# Patient Record
Sex: Female | Born: 1950 | Race: White | Hispanic: No | Marital: Married | Smoking: Never smoker
Health system: Southern US, Community
[De-identification: ages and names within clinical notes are randomized; demographics above are authoritative.]

## PROBLEM LIST (undated history)

## (undated) DIAGNOSIS — G43909 Migraine, unspecified, not intractable, without status migrainosus: Secondary | ICD-10-CM

## (undated) DIAGNOSIS — K449 Diaphragmatic hernia without obstruction or gangrene: Secondary | ICD-10-CM

## (undated) DIAGNOSIS — D649 Anemia, unspecified: Secondary | ICD-10-CM

## (undated) DIAGNOSIS — F329 Major depressive disorder, single episode, unspecified: Secondary | ICD-10-CM

## (undated) DIAGNOSIS — F32A Depression, unspecified: Secondary | ICD-10-CM

## (undated) DIAGNOSIS — N200 Calculus of kidney: Secondary | ICD-10-CM

## (undated) DIAGNOSIS — M1990 Unspecified osteoarthritis, unspecified site: Secondary | ICD-10-CM

## (undated) HISTORY — PX: JOINT REPLACEMENT: SHX530

## (undated) HISTORY — PX: ABDOMINAL HYSTERECTOMY: SHX81

## (undated) HISTORY — DX: Calculus of kidney: N20.0

## (undated) HISTORY — DX: Major depressive disorder, single episode, unspecified: F32.9

## (undated) HISTORY — DX: Unspecified osteoarthritis, unspecified site: M19.90

## (undated) HISTORY — PX: TMJ ARTHROPLASTY: SHX1066

## (undated) HISTORY — DX: Migraine, unspecified, not intractable, without status migrainosus: G43.909

## (undated) HISTORY — DX: Depression, unspecified: F32.A

---

## 2010-12-09 NOTE — H&P (Addendum)
[name], [name]                  ACCOUNT NO.:  [id number]  MEDICAL RECORD NO.:  [id number]  LOCATION:                               FACILITY:  [facility]  PHYSICIAN:  Madlyn Frankel. Charlann Boxer, M.D.  DATE OF BIRTH:  Aug 20, 1950  DATE OF ADMISSION:  12/01/2010 DATE OF DISCHARGE:                             HISTORY & PHYSICAL   DATE OF SURGERY:  December 13, 2010  CHIEF COMPLAINT:  Left knee osteoarthritis.  HISTORY OF PRESENT ILLNESS:  The patient is a 60 year old white female in no acute distress.  The patient states that she has been having left knee pain for about 5 years.  This has just progressively been increasing.  The patient denies any initial trauma or incident.  The patient has had a previous left knee arthroscopy with Dr.  Fabio Pierce results of which did not help her pain.  She then was discussing this with a friend who recommended Dr. Charlann Boxer.  She presented to the clinic, x- rays were taken which did show arthritic changes within the left knee. The patient has tried anti-inflammatories, steroid injections, and Synvisc injections, all of which were minimally effective at first, but eventually her symptoms returned.  Various options were discussed with the patient.  The patient wishes to proceed with surgery.  Risks, benefits, and expectations of procedure discussed with the patient.  The patient understands risks, benefits, and expectations and wishes to proceed with surgery.  The patient is unsure of where she will be discharged after the hospital, either home or rehab.  The patient has not been given her postop medications.  The patient is a candidate for tranexamic acid and will be given this prior to surgery.  PRIMARY CARE PHYSICIAN:  Web designer.  PAST MEDICAL HISTORY: 1. Migraines. 2. Impaired memory, vision, and hearing. 3. Arthritis.  PAST SURGICAL HISTORY: 1. TMJ in 1991. 2. Carpal tunnel on the right side in 1987. 3. Carpal tunnel on the left in 1988. 4.  Ovarian cyst excision in 1981. 5. Polyps removed in 2004. 6. Left knee arthroscopy, unknown year.  MEDICATIONS: 1. Imitrex nose spray p.r.n. 2. Tylenol. 3. Lortab 5/500, 1 to 2  p.o. q.4-6 hours p.r.n. pain.  ALLERGIES:  The patient states she is allergic to both NSAIDs and COMPAZINE.  SOCIAL HISTORY:  The patient denies use of both alcohol and tobacco.  REVIEW OF SYSTEMS:  The patient does state she has headaches and she has joint pain, joint swelling, muscular pain, spasms, and morning stiffness of the left knee especially.  PHYSICAL EXAMINATION:  GENERAL:  The patient is a 60 year old white female in no acute distress. VITAL SIGNS:  Stable.  Blood pressure is 150/100, pulse 109, respirations 20. HEENT:  Pupils equal round and reactive to light and accommodation. Throat is clear. NECK:  Supple.  No JVD.  No carotid bruits.  No lymphadenopathy noted. CARDIAC:  Normal appearing S1 and S2.  No murmur appreciated. RESPIRATORY:  Lungs clear to auscultation bilaterally. NEURO:  The patient is oriented x3. ORTHO:  Pertaining to the left knee, the patient has pain on palpation over the medial anterior tibial surface.  No pain on rest of the entire knee.  There is no pain over the quadriceps tendon, patellar tendon, or patella.  There is no pain over the medial or lateral joint lines. Range of motion is limited to almost 0 degrees to about 90.  The patient has a coarse crepitus.  Retropatellar and range of motion is painful. The patient also has pain with going up or down steps.  The patient has +1 dorsalis pedis pulse.  The patient is distally neurovascularly intact.  IMPRESSION:  Left knee osteoarthritis.  STUDIES:  X-rays as above.  PLAN:  The patient admitted to the hospital to undergo left total knee replacement by Dr. Charlann Boxer at [facility] on December 13, 2010. Risks, benefits and expectations of procedure discussed with the patient.  The patient understands risks,  benefits, and expectations and wishes to proceed with surgery.    ______________________________ Lanney Gins, PA   ______________________________ Madlyn Frankel. Charlann Boxer, M.D.    MB/MEDQ  D:  12/08/2010  T:  12/08/2010  Job:  [id number]  Electronically Signed by Lanney Gins PA on 01/18/2011 02:14:24 PM Electronically Signed by Durene Romans M.D. on 01/20/2011 11:02:23 AM

## 2010-12-10 ENCOUNTER — Other Ambulatory Visit: Payer: Self-pay | Admitting: Orthopedic Surgery

## 2010-12-10 ENCOUNTER — Encounter (HOSPITAL_COMMUNITY): Payer: Managed Care, Other (non HMO)

## 2010-12-10 LAB — URINALYSIS, ROUTINE W REFLEX MICROSCOPIC
Bilirubin Urine: NEGATIVE
Glucose, UA: NEGATIVE mg/dL
Ketones, ur: NEGATIVE mg/dL
Leukocytes, UA: NEGATIVE
pH: 5.5 (ref 5.0–8.0)

## 2010-12-10 LAB — BASIC METABOLIC PANEL
BUN: 11 mg/dL (ref 6–23)
CO2: 25 mEq/L (ref 19–32)
Chloride: 103 mEq/L (ref 96–112)
Creatinine, Ser: 0.65 mg/dL (ref 0.50–1.10)
Glucose, Bld: 102 mg/dL — ABNORMAL HIGH (ref 70–99)

## 2010-12-10 LAB — DIFFERENTIAL
Basophils Relative: 1 % (ref 0–1)
Eosinophils Absolute: 0.1 10*3/uL (ref 0.0–0.7)
Monocytes Absolute: 0.5 10*3/uL (ref 0.1–1.0)
Monocytes Relative: 8 % (ref 3–12)

## 2010-12-10 LAB — CBC
HCT: 41.3 % (ref 36.0–46.0)
Hemoglobin: 13.1 g/dL (ref 12.0–15.0)
MCH: 28.1 pg (ref 26.0–34.0)
MCHC: 31.7 g/dL (ref 30.0–36.0)

## 2010-12-13 ENCOUNTER — Ambulatory Visit (HOSPITAL_COMMUNITY)
Admission: RE | Admit: 2010-12-13 | Discharge: 2010-12-13 | Disposition: A | Discharge status: Home / Self Care | Payer: Managed Care, Other (non HMO) | Source: Ambulatory Visit | Attending: Orthopedic Surgery | Admitting: Orthopedic Surgery

## 2010-12-13 DIAGNOSIS — Z79899 Other long term (current) drug therapy: Secondary | ICD-10-CM | POA: Insufficient documentation

## 2010-12-13 DIAGNOSIS — Z01812 Encounter for preprocedural laboratory examination: Secondary | ICD-10-CM | POA: Insufficient documentation

## 2010-12-13 DIAGNOSIS — M1710 Unilateral primary osteoarthritis, unspecified knee: Secondary | ICD-10-CM | POA: Insufficient documentation

## 2010-12-13 LAB — TYPE AND SCREEN
ABO/RH(D): A NEG
Antibody Screen: NEGATIVE

## 2010-12-29 ENCOUNTER — Other Ambulatory Visit: Payer: Self-pay | Admitting: Orthopedic Surgery

## 2010-12-29 ENCOUNTER — Encounter (HOSPITAL_COMMUNITY): Payer: Managed Care, Other (non HMO)

## 2010-12-29 LAB — BASIC METABOLIC PANEL
CO2: 26 mEq/L (ref 19–32)
Calcium: 9.2 mg/dL (ref 8.4–10.5)
GFR calc non Af Amer: >90 mL/min (ref >90)
Sodium: 137 mEq/L (ref 135–145)

## 2010-12-29 LAB — CBC
HCT: 41.6 % (ref 36.0–46.0)
Hemoglobin: 13.1 g/dL (ref 12.0–15.0)
MCH: 28.4 pg (ref 26.0–34.0)
RBC: 4.62 MIL/uL (ref 3.87–5.11)

## 2010-12-29 LAB — SURGICAL PCR SCREEN
MRSA, PCR: NEGATIVE
Staphylococcus aureus: POSITIVE — AB

## 2010-12-29 LAB — DIFFERENTIAL
Basophils Relative: 0 % (ref 0–1)
Lymphocytes Relative: 30 % (ref 12–46)
Lymphs Abs: 2.1 10*3/uL (ref 0.7–4.0)
Monocytes Relative: 6 % (ref 3–12)
Neutro Abs: 4.4 10*3/uL (ref 1.7–7.7)
Neutrophils Relative %: 63 % (ref 43–77)

## 2010-12-29 LAB — URINALYSIS, ROUTINE W REFLEX MICROSCOPIC
Glucose, UA: NEGATIVE mg/dL
Leukocytes, UA: NEGATIVE
pH: 5 (ref 5.0–8.0)

## 2010-12-29 LAB — APTT: aPTT: 28 seconds (ref 24–37)

## 2010-12-29 LAB — PROTIME-INR: Prothrombin Time: 12.5 seconds (ref 11.6–15.2)

## 2011-01-03 ENCOUNTER — Inpatient Hospital Stay (HOSPITAL_COMMUNITY)
Admission: RE | Admit: 2011-01-03 | Discharge: 2011-01-06 | DRG: 470 | Disposition: A | Discharge status: Home Health | Payer: Managed Care, Other (non HMO) | Source: Ambulatory Visit | Attending: Orthopedic Surgery | Admitting: Orthopedic Surgery

## 2011-01-03 DIAGNOSIS — Z6833 Body mass index (BMI) 33.0-33.9, adult: Secondary | ICD-10-CM

## 2011-01-03 DIAGNOSIS — M1710 Unilateral primary osteoarthritis, unspecified knee: Principal | ICD-10-CM | POA: Diagnosis present

## 2011-01-03 DIAGNOSIS — M129 Arthropathy, unspecified: Secondary | ICD-10-CM | POA: Diagnosis present

## 2011-01-03 DIAGNOSIS — H9190 Unspecified hearing loss, unspecified ear: Secondary | ICD-10-CM | POA: Diagnosis present

## 2011-01-03 DIAGNOSIS — Z01812 Encounter for preprocedural laboratory examination: Secondary | ICD-10-CM

## 2011-01-03 DIAGNOSIS — H547 Unspecified visual loss: Secondary | ICD-10-CM | POA: Diagnosis present

## 2011-01-03 DIAGNOSIS — R413 Other amnesia: Secondary | ICD-10-CM | POA: Diagnosis present

## 2011-01-03 DIAGNOSIS — Z7382 Dual sensory impairment: Secondary | ICD-10-CM

## 2011-01-04 LAB — CBC
HCT: 28.6 % — ABNORMAL LOW (ref 36.0–46.0)
MCH: 28.3 pg (ref 26.0–34.0)
MCHC: 31.8 g/dL (ref 30.0–36.0)
MCV: 89.1 fL (ref 78.0–100.0)
RDW: 15.9 % — ABNORMAL HIGH (ref 11.5–15.5)

## 2011-01-04 LAB — BASIC METABOLIC PANEL
BUN: 9 mg/dL (ref 6–23)
Creatinine, Ser: 0.59 mg/dL (ref 0.50–1.10)
GFR calc Af Amer: >90 mL/min (ref >90)
GFR calc non Af Amer: >90 mL/min (ref >90)

## 2011-01-05 LAB — CBC
MCHC: 32 g/dL (ref 30.0–36.0)
RDW: 16.1 % — ABNORMAL HIGH (ref 11.5–15.5)

## 2011-01-05 LAB — BASIC METABOLIC PANEL
BUN: 5 mg/dL — ABNORMAL LOW (ref 6–23)
GFR calc Af Amer: >90 mL/min (ref >90)
GFR calc non Af Amer: >90 mL/min (ref >90)
Potassium: 3.5 mEq/L (ref 3.5–5.1)
Sodium: 137 mEq/L (ref 135–145)

## 2011-01-10 NOTE — Op Note (Signed)
NAMEJAIDAH, [name]                  ACCOUNT NO.:  [id number]  MEDICAL RECORD NO.:  [id number]  LOCATION:  1537                         FACILITY:  [facility]  PHYSICIAN:  Andrea Frankel. Charlann Figueroa, M.D.  DATE OF BIRTH:  06-Dec-1950  DATE OF PROCEDURE:  01/03/2011 DATE OF DISCHARGE:                              OPERATIVE REPORT   PREOPERATIVE DIAGNOSIS:  Left knee osteoarthritis.  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  PROCEDURE:  Left total knee replacement utilizing Smith and Nephew titanium size 3 femoral component, posterior stabilized with size 2 titanium tibial base plate, 11 mm posterior stabilized insert, and a 35 patellar button.  SURGEON:  Andrea Frankel. Charlann Figueroa, M.D.  ASSISTANT:  Andrea Gins, PA.  Please note that physician assistant [name] was present for the entirely case from preoperative positioning, perioperative retractor placement and management as well as general facilitation of the case as well as primary wound closure.  ANESTHESIA:  Spinal.  SPECIMENS:  None.  COMPLICATION:  None.  DRAINS:  One Hemovac.  TOURNIQUET TIME:  45 minutes at 250 mmHg.  INDICATION FOR PROCEDURE:  [name] is a 60 year old female who had presented to the office for left knee pain.  Radiographs revealed bone- on-bone arthritis of the left knee mainly anterior and medial.  She had failed conservative measures, cortisone injections, and antiinflammatories.  She had already been placed on narcotics unfortunately.  After reviewing risks of infection, DVT, component failure, need for revision surgery, as well as the postoperative course and expectations particularly given her narcotic use, she was consented for left knee replacement.  DETAILS OF PROCEDURE:  The patient was brought to the operative theater. Once adequate anesthesia, preoperative antibiotics administered, patient was positioned supine with left thigh tourniquet placed.  The left  lower extremity was then prepped and draped in a sterile fashion with the foot placed in a Mayo leg holder.  Time out was performed identifying the patient, planned procedure, and extremity.  The left lower extremity was exsanguinated tourniquet elevated to 250 mmHg.  A midline incision was made followed by medial parapatellar arthrotomy.  Following initial exposure, the attention was first directed to patella.  Precut measurement was only about 20 mm.  I only resected bare minimum and used a 35 patellar button to cover the cut surface.  I changed this 9 mm thick polyethylene patella, which got the patellar height back to 22 mm. Lug holes were drilled and the patella was placed to protect the patella from retractors and saw blades.  Attention was now directed to the femur.  Femoral canal was opened with a drill.  Intramedullary rod was passed at 5 degrees of valgus, 11 mm of bone resected off the distal femur for posterior stabilized insert.  The tibia was then subluxated anteriorly using extramedullary guide that measured resection of 9 mm bone at the proximal lateral tibia was carried out.  Following this resection, we confirmed the gap to be stable with at least a 9 mm insert and that said lug holes were intact as well as the cut was perpendicular in the coronal plane.  Once this was all confirmed, I sized the  femur to be a size 3.  The rotation was set off the epicondylar axis in addition to comparing this to right side one anteriorly.  Once this was done, the 4-in-1 cutting block was then pinned in position.  Anterior, posterior, chamfer cuts made without difficulty nor notching.  Final box was prepared.  The trial femur was then impacted into position and final tibial preparation carried out.  The tibial tray was prepared based on the rotation with trial reduction with the tray insert in place.  It was marked and was on the bone.  The tibia was drilled and keel punched.  At  this point, all trial components removed.  The final components were opened holding the polyethylene insert.  The synovial capsule junction knee was injected with 0.25% Marcaine with epinephrine.  The knee was irrigated with normal saline solution, pulse lavage, and cement mixed. The final components were cemented onto clean and dried cut surfaces of bone.  The knee was brought to extension with a 11 mm insert.  With this, the knee came to full extension.  Extruded cement was removed.  Once the cement had fully cured, excessive cement was removed throughout the knee.  We chose the final 11 mm insert posterior stabilized high flex insert with the cement at the knee with match to tibial tray.  The tourniquet had been let down after 45 minutes.  The knee was re- irrigated with normal saline solution.  No significant hemostasis required.  Hemovac drain was placed deep.  The extensor mechanism was then reapproximated using #1 Vicryl with the knee in flexion.  The remaining wound was closed with 2-0 Vicryl and a running 4-0 Monocryl. The knee was cleaned, dried, dressed sterilely using Dermabond and Aquacel dressing.  Drain site dressed separately.  The patient was then brought to recovery room in stable condition tolerating procedure well with no apparent complications.  Again, Andrea Gins, PA-C was present during the entire case for assistance with preoperative positioning, perioperative retractor management, general facilitation of the case, as well as primary wound closure.     [name], M.D.     MDO/MEDQ  D:  01/03/2011  T:  01/04/2011  Job:  [id number]  Electronically Signed by Durene Romans M.D. on 01/10/2011 12:39:05 PM

## 2011-01-10 NOTE — Discharge Summary (Signed)
[name], [name]                  ACCOUNT NO.:  [id number]  MEDICAL RECORD NO.:  [id number]  LOCATION:  1537                         FACILITY:  [facility]  PHYSICIAN:  Madlyn Frankel. Charlann Boxer, M.D.  DATE OF BIRTH:  [birth year]  DATE OF ADMISSION:  01/03/2011 DATE OF DISCHARGE:  01/06/2011                              DISCHARGE SUMMARY   PROCEDURE:  Left total knee arthroplasty.  ATTENDING PHYSICIAN:  Madlyn Frankel. Charlann Boxer, M.D.  ADMITTING DIAGNOSIS:  Left knee osteoarthritis.  DISCHARGE DIAGNOSES: 1. Status post left total knee arthroplasty. 2. Migraines. 3. Impaired memory, vision, and hearing. 4. Arthritis.  HISTORY OF PRESENT ILLNESS:  The patient is a 60 year old white female, in no acute distress.  The patient states that she has been having left knee pain for about 5 years.  This has progressively been increasing over the years.  The patient denies any initial trauma or incident.  The patient has had a previous left knee arthroscopy by Dr. Lynnette Caffey, the results of which did not help her pain.  She was then discussing this with her friend who recommended Dr. Charlann Boxer and she presented to the clinic.  X-rays were taken, which did show arthritic changes within the left knee.  The patient tried anti-inflammatories, steroid injections and Synvisc injections, all of which were minimally effective at first, but eventually did not help in controlling her symptoms.  Various options were discussed with the patient.  The patient wishes to proceed with surgery.  Risks, benefits, and expectations of the procedure were discussed with the patient.  The patient understands risks, benefits, and expectations and wishes to proceed with left total knee arthroplasty per Dr. Charlann Boxer at [facility].  HOSPITAL COURSE:  The patient underwent the above-stated procedure on January 03, 2011.  The patient tolerated the procedure well, was brought to the recovery room in good condition and subsequently to the  floor.  Postop day 1, January 04, 2011, the patient doing well, no events.  Pain was present in the left knee, but this is controlled by medications. She is afebrile, vital signs stable.  H and H 9.1/28.6.  She is distally neurovascularly intact.  Dressing is good, clean, dry, and intact. Hemovac drain was taken out.  IV was changed to a saline lock.  The patient did well with physical therapy.  Postop day 2, January 05, 2011, the patient doing well, no events.  Pain controlled with medications, afebrile, vital signs stable.  H and H 9.5/29.7.  She is distally and neurovascularly intact.  Dressing is good, clean, dry, and intact.  The patient did begin well with physical therapy.  Postop day 3, January 06, 2011, the patient doing okay, not great with pain control.  She was changed to p.o. Dilaudid, which did help control her pain more so than previous medication.  No new labs were done.  She was afebrile, vital signs stable.  Left knee was clean, dry, and intact. It was felt the patient was doing well enough to be discharged home with home health PT after having physical therapy in the hospital.  DISCHARGE CONDITION:  Good.  DISCHARGE INSTRUCTIONS:  The patient will be discharged home with  home health PT after having physical therapy in the hospital.  She will be weightbearing as tolerated on the left leg, should maintain her surgical dressing for about 8 days after which time should replace with gauze and tape.  Patient to keep the area dry and clean until followup.  Follow up with me in 2 weeks at [facility].  The patient is to call with any questions or concerns.  DISCHARGE MEDICATIONS: 1. Tylenol 325 mg one to two p.o. q.4 hours p.r.n. pain. 2. Benadryl 25 mg one p.o. q.6 hours p.r.n. 3. Colace 100 mg one p.o. b.i.d. constipation. 4. Iron sulfate 325 mg one p.o. t.i.d. times 2-3 weeks. 5. Hydromorphone 2 mg one to three p.o. q.4-6 hours p.r.n. pain. 6. MiraLAX 17  g p.o. q. day constipation. 7. Robaxin 500 mg one p.o. q.6 hours p.r.n. muscle spasms. 8. Xarelto 10 mg one p.o. q. day times 12 days. 9. Diphenhydramine 25 mg one p.o. q.6 hours p.r.n. 10.Imitrex one spray nasally twice daily as needed. 11.Mupirocin topical 2% one application nasally twice a day.    ______________________________ Lanney Gins, PA   ______________________________ Madlyn Frankel. Charlann Boxer, M.D.    MB/MEDQ  D:  01/06/2011  T:  01/06/2011  Job:  [id number]  Electronically Signed by Lanney Gins PA on 01/06/2011 01:03:03 PM Electronically Signed by Durene Romans M.D. on 01/10/2011 12:39:11 PM

## 2011-06-28 ENCOUNTER — Other Ambulatory Visit: Payer: Self-pay | Admitting: Sports Medicine

## 2011-06-28 DIAGNOSIS — M25561 Pain in right knee: Secondary | ICD-10-CM

## 2011-07-03 ENCOUNTER — Ambulatory Visit
Admission: RE | Admit: 2011-07-03 | Discharge: 2011-07-03 | Disposition: A | Discharge status: Home / Self Care | Payer: Managed Care, Other (non HMO) | Source: Ambulatory Visit | Attending: Sports Medicine | Admitting: Sports Medicine

## 2011-07-03 DIAGNOSIS — M25561 Pain in right knee: Secondary | ICD-10-CM

## 2014-12-02 DIAGNOSIS — G43909 Migraine, unspecified, not intractable, without status migrainosus: Secondary | ICD-10-CM | POA: Insufficient documentation

## 2014-12-02 DIAGNOSIS — D649 Anemia, unspecified: Secondary | ICD-10-CM | POA: Insufficient documentation

## 2014-12-02 DIAGNOSIS — E785 Hyperlipidemia, unspecified: Secondary | ICD-10-CM | POA: Insufficient documentation

## 2015-04-09 DIAGNOSIS — F410 Panic disorder [episodic paroxysmal anxiety] without agoraphobia: Secondary | ICD-10-CM | POA: Insufficient documentation

## 2015-04-09 DIAGNOSIS — M1990 Unspecified osteoarthritis, unspecified site: Secondary | ICD-10-CM | POA: Insufficient documentation

## 2015-04-09 DIAGNOSIS — F4310 Post-traumatic stress disorder, unspecified: Secondary | ICD-10-CM | POA: Insufficient documentation

## 2015-12-31 DIAGNOSIS — Z79899 Other long term (current) drug therapy: Secondary | ICD-10-CM | POA: Insufficient documentation

## 2016-02-09 DIAGNOSIS — Z2821 Immunization not carried out because of patient refusal: Secondary | ICD-10-CM | POA: Insufficient documentation

## 2016-02-09 DIAGNOSIS — Z5320 Procedure and treatment not carried out because of patient's decision for unspecified reasons: Secondary | ICD-10-CM | POA: Insufficient documentation

## 2017-06-09 ENCOUNTER — Encounter (HOSPITAL_COMMUNITY): Payer: Self-pay | Admitting: *Deleted

## 2017-06-09 ENCOUNTER — Other Ambulatory Visit: Payer: Self-pay

## 2017-06-09 ENCOUNTER — Emergency Department (HOSPITAL_COMMUNITY)
Admission: EM | Admit: 2017-06-09 | Discharge: 2017-06-09 | Disposition: A | Discharge status: Home / Self Care | Payer: Medicare Other | Attending: Emergency Medicine | Admitting: Emergency Medicine

## 2017-06-09 ENCOUNTER — Emergency Department (HOSPITAL_COMMUNITY): Payer: Medicare Other

## 2017-06-09 DIAGNOSIS — W0110XA Fall on same level from slipping, tripping and stumbling with subsequent striking against unspecified object, initial encounter: Secondary | ICD-10-CM | POA: Insufficient documentation

## 2017-06-09 DIAGNOSIS — Y92009 Unspecified place in unspecified non-institutional (private) residence as the place of occurrence of the external cause: Secondary | ICD-10-CM | POA: Diagnosis not present

## 2017-06-09 DIAGNOSIS — S0990XA Unspecified injury of head, initial encounter: Secondary | ICD-10-CM | POA: Insufficient documentation

## 2017-06-09 DIAGNOSIS — Y939 Activity, unspecified: Secondary | ICD-10-CM | POA: Diagnosis not present

## 2017-06-09 DIAGNOSIS — S2249XA Multiple fractures of ribs, unspecified side, initial encounter for closed fracture: Secondary | ICD-10-CM | POA: Diagnosis not present

## 2017-06-09 DIAGNOSIS — Y999 Unspecified external cause status: Secondary | ICD-10-CM | POA: Diagnosis not present

## 2017-06-09 DIAGNOSIS — R0602 Shortness of breath: Secondary | ICD-10-CM | POA: Insufficient documentation

## 2017-06-09 DIAGNOSIS — S2220XA Unspecified fracture of sternum, initial encounter for closed fracture: Secondary | ICD-10-CM | POA: Insufficient documentation

## 2017-06-09 DIAGNOSIS — R0781 Pleurodynia: Secondary | ICD-10-CM | POA: Diagnosis present

## 2017-06-09 HISTORY — DX: Diaphragmatic hernia without obstruction or gangrene: K44.9

## 2017-06-09 HISTORY — DX: Migraine, unspecified, not intractable, without status migrainosus: G43.909

## 2017-06-09 HISTORY — DX: Anemia, unspecified: D64.9

## 2017-06-09 LAB — CBC
HCT: 29 % — ABNORMAL LOW (ref 36.0–46.0)
HEMOGLOBIN: 8.4 g/dL — AB (ref 12.0–15.0)
MCH: 23.2 pg — AB (ref 26.0–34.0)
MCHC: 29 g/dL — ABNORMAL LOW (ref 30.0–36.0)
MCV: 80.1 fL (ref 78.0–100.0)
Platelets: 296 10*3/uL (ref 150–400)
RBC: 3.62 MIL/uL — AB (ref 3.87–5.11)
RDW: 18 % — ABNORMAL HIGH (ref 11.5–15.5)
WBC: 6.8 10*3/uL (ref 4.0–10.5)

## 2017-06-09 LAB — HEPATIC FUNCTION PANEL
ALT: 9 U/L — ABNORMAL LOW (ref 14–54)
AST: 15 U/L (ref 15–41)
Albumin: 3.3 g/dL — ABNORMAL LOW (ref 3.5–5.0)
Alkaline Phosphatase: 79 U/L (ref 38–126)
TOTAL PROTEIN: 6.6 g/dL (ref 6.5–8.1)
Total Bilirubin: 0.4 mg/dL (ref 0.3–1.2)

## 2017-06-09 LAB — BASIC METABOLIC PANEL
ANION GAP: 10 (ref 5–15)
BUN: 8 mg/dL (ref 6–20)
CALCIUM: 8.6 mg/dL — AB (ref 8.9–10.3)
CHLORIDE: 103 mmol/L (ref 101–111)
CO2: 24 mmol/L (ref 22–32)
Creatinine, Ser: 0.67 mg/dL (ref 0.44–1.00)
GFR calc non Af Amer: >60 mL/min (ref >60)
Glucose, Bld: 185 mg/dL — ABNORMAL HIGH (ref 65–99)
Potassium: 4.4 mmol/L (ref 3.5–5.1)
SODIUM: 137 mmol/L (ref 135–145)

## 2017-06-09 LAB — URINALYSIS, ROUTINE W REFLEX MICROSCOPIC
BILIRUBIN URINE: NEGATIVE
Glucose, UA: NEGATIVE mg/dL
Hgb urine dipstick: NEGATIVE
Ketones, ur: 5 mg/dL — AB
Leukocytes, UA: NEGATIVE
NITRITE: NEGATIVE
PROTEIN: NEGATIVE mg/dL
SPECIFIC GRAVITY, URINE: 1.029 (ref 1.005–1.030)
pH: 7 (ref 5.0–8.0)

## 2017-06-09 LAB — I-STAT TROPONIN, ED
TROPONIN I, POC: 0 ng/mL (ref 0.00–0.08)
Troponin i, poc: 0 ng/mL (ref 0.00–0.08)

## 2017-06-09 LAB — RAPID URINE DRUG SCREEN, HOSP PERFORMED
Amphetamines: NOT DETECTED
Barbiturates: NOT DETECTED
Benzodiazepines: NOT DETECTED
Cocaine: NOT DETECTED
Opiates: POSITIVE — AB
Tetrahydrocannabinol: NOT DETECTED

## 2017-06-09 LAB — ETHANOL

## 2017-06-09 MED ORDER — SODIUM CHLORIDE 0.9 % IV BOLUS (SEPSIS)
1000.0000 mL | Freq: Once | INTRAVENOUS | Status: AC
  Administered 2017-06-09: 1000 mL via INTRAVENOUS

## 2017-06-09 MED ORDER — CYCLOBENZAPRINE HCL 5 MG PO TABS: 5.0000 mg | ORAL_TABLET | Freq: Three times a day (TID) | ORAL | 0 refills | Status: AC | PRN

## 2017-06-09 MED ORDER — LORAZEPAM 2 MG/ML IJ SOLN
1.0000 mg | Freq: Once | INTRAMUSCULAR | Status: AC
  Administered 2017-06-09: 1 mg via INTRAVENOUS
  Filled 2017-06-09: qty 1

## 2017-06-09 MED ORDER — MORPHINE SULFATE (PF) 4 MG/ML IV SOLN
4.0000 mg | Freq: Once | INTRAVENOUS | Status: AC
  Administered 2017-06-09: 4 mg via INTRAVENOUS
  Filled 2017-06-09: qty 1

## 2017-06-09 MED ORDER — HYDROMORPHONE HCL 1 MG/ML IJ SOLN
1.0000 mg | Freq: Once | INTRAMUSCULAR | Status: AC
  Administered 2017-06-09: 1 mg via INTRAVENOUS
  Filled 2017-06-09: qty 1

## 2017-06-09 MED ORDER — IOPAMIDOL (ISOVUE-370) INJECTION 76%
INTRAVENOUS | Status: AC
  Administered 2017-06-09: 100 mL
  Filled 2017-06-09: qty 100

## 2017-06-09 MED ORDER — LIDOCAINE 5 % EX PTCH: 1.0000 | MEDICATED_PATCH | CUTANEOUS | 0 refills | Status: AC

## 2017-06-09 NOTE — ED Provider Notes (Signed)
[facility] Provider Note   CSN: [id number] Arrival date & time: 06/09/17  1122     History   Chief Complaint Chief Complaint  Patient presents with  . Chest Pain  . Shortness of Breath    HPI [name] is a 67 y.o. female hx of anemia, chronic pain here with chest pain, SOB. Patient states that 3 days ago she almost passed out and then EMS started chest compressions. [facility] was [facility] and the ED note mentioned possible narcotic overdose and was given narcan and then was discharged.  For the last several days, patient has some pleuritic chest pain worse when she takes a deep breath.  She has been taking her Tylenol No. 4 with minimal relief. She called her doctor and was sent for evaluation. Denies any thoughts to harm herself or others.   The history is provided by the patient.    Past Medical History:  Diagnosis Date  . Anemia   . Hiatal hernia   . Migraine     There are no active problems to display for this patient.   Past Surgical History:  Procedure Laterality Date  . JOINT REPLACEMENT       OB History   None      Home Medications    Prior to Admission medications   Not on File    Family History History reviewed. No pertinent family history.  Social History Social History   Tobacco Use  . Smoking status: Never Smoker  Substance Use Topics  . Alcohol use: Yes    Comment: occ  . Drug use: Never     Allergies   Aspirin and Nsaids   Review of Systems Review of Systems  Respiratory: Positive for shortness of breath.   Cardiovascular: Positive for chest pain.  All other systems reviewed and are negative.    Physical Exam Updated Vital Signs BP (!) 148/64   Pulse 85   Temp 98.2 F (36.8 C) (Oral)   Resp 19   Ht 5' (1.524 m)   Wt 72.6 kg (160 lb)   SpO2 96%   BMI 31.25 kg/m   Physical Exam  Constitutional: She appears well-developed.  Uncomfortable   HENT:  Head:  Normocephalic.  Eyes: Pupils are equal, round, and reactive to light.  Neck: Normal range of motion.  Cardiovascular: Normal rate, regular rhythm and normal pulses.  Pulmonary/Chest: Effort normal and breath sounds normal.  Tenderness on the sternum, no obvious ecchymosis or obvious deformity   Abdominal: Bowel sounds are normal.  Musculoskeletal: Normal range of motion.       Right lower leg: Normal.       Left lower leg: Normal.  Neurological: She is alert.  Skin: Skin is warm. Capillary refill takes less than 2 seconds.  Psychiatric: She has a normal mood and affect.  Nursing note and vitals reviewed.    ED Treatments / Results  Labs (all labs ordered are listed, but only abnormal results are displayed) Labs Reviewed  BASIC METABOLIC PANEL - Abnormal; Notable for the following components:      Result Value   Glucose, Bld 185 (*)    Calcium 8.6 (*)    All other components within normal limits  CBC - Abnormal; Notable for the following components:   RBC 3.62 (*)    Hemoglobin 8.4 (*)    HCT 29.0 (*)    MCH 23.2 (*)    MCHC 29.0 (*)  RDW 18.0 (*)    All other components within normal limits  HEPATIC FUNCTION PANEL - Abnormal; Notable for the following components:   Albumin 3.3 (*)    ALT 9 (*)    Bilirubin, Direct <0.1 (*)    All other components within normal limits  ETHANOL  RAPID URINE DRUG SCREEN, HOSP PERFORMED  URINALYSIS, ROUTINE W REFLEX MICROSCOPIC  I-STAT TROPONIN, ED  I-STAT TROPONIN, ED    EKG EKG Interpretation  Date/Time:  Friday June 09 2017 11:38:25 EDT Ventricular Rate:  90 PR Interval:    QRS Duration: 80 QT Interval:  354 QTC Calculation: 433 R Axis:   127 Text Interpretation:  Accelerated Junctional rhythm Lateral infarct , age undetermined T wave abnormality, consider inferior ischemia Abnormal ECG No previous ECGs available Confirmed by Richardean Canal ([id number]) on 06/09/2017 4:09:51 PM   Radiology Dg Chest 2 View  Result Date:  06/09/2017 CLINICAL DATA:  Syncopal episode. EXAM: CHEST - 2 VIEW COMPARISON:  04/26/2017. FINDINGS: Mediastinum and hilar structures normal. Cardiomegaly with normal pulmonary vascularity. Mild bilateral subsegmental atelectasis. No pleural effusion or pneumothorax. Degenerative change thoracic spine. IMPRESSION: 1.  Cardiomegaly.  No pulmonary venous congestion. 2.  Mild bilateral subsegmental atelectasis. Electronically Signed   By: Maisie Fus  Register   On: 06/09/2017 12:21   Ct Head Wo Contrast  Result Date: 06/09/2017 CLINICAL DATA:  Patient hit head against refrigerator with loss of consciousness 2 weeks ago. Persistent pain. EXAM: CT HEAD WITHOUT CONTRAST TECHNIQUE: Contiguous axial images were obtained from the base of the skull through the vertex without intravenous contrast. COMPARISON:  01/12/2015 FINDINGS: Brain: No evidence of acute infarction, hemorrhage, hydrocephalus, extra-axial collection or mass lesion/mass effect. Superficial atrophy with sulcal prominence is noted. Chronic mild-to-moderate periventricular and subcortical white matter hypodensities compatible with microvascular ischemia. Hydrocephalus. Midline fourth ventricle and basal cisterns. Cerebellum and brainstem are stable in appearance without acute appearing abnormality. Vascular: No hyperdense vessel signs. Skull: No skull fracture. Sinuses/Orbits: No acute finding. Other: No significant scalp soft tissue swelling. IMPRESSION: 1. No acute intracranial abnormality or skull fracture. 2. 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 Electronically Signed   By: Tollie Eth M.D.   On: 06/09/2017 18:29   Ct Angio Chest Pe W And/or Wo Contrast  Result Date: 06/09/2017 CLINICAL DATA:  Syncopal episode 4 days ago, underwent chest compressions, chest pain and shortness of breath since EXAM: CT ANGIOGRAPHY CHEST WITH CONTRAST TECHNIQUE: Multidetector CT imaging of the chest was performed using  the standard protocol during bolus administration of intravenous contrast. Multiplanar CT image reconstructions and MIPs were obtained to evaluate the vascular anatomy. CONTRAST:  67 ML ISOVUE-370 IOPAMIDOL (ISOVUE-370) INJECTION 76% IV COMPARISON:  01/12/2015 FINDINGS: Cardiovascular: Aorta normal caliber without aneurysm or dissection. Pulmonary arteries well opacified and patent. No evidence of pulmonary embolism. No pericardial effusion. Mediastinum/Nodes: Moderate-sized hiatal hernia. Esophagus unremarkable. Base of cervical region normal appearance. No thoracic adenopathy. Upper normal sized RIGHT hilar lymph node 10 mm short axis image 48. Lungs/Pleura: Scattered bibasilar atelectasis. Minimal nonspecific ground-glass opacity RIGHT lung. No segmental consolidation, pleural effusion or pneumothorax. Upper Abdomen: 5 mm nonobstructing calculus upper pole RIGHT kidney. Remaining visualized upper abdomen unremarkable. Musculoskeletal: Cortical contour abnormality of mid sternum question nondisplaced fracture. Probable vertebral hemangioma T6 vertebral body. Slight angular deformities of anterior RIGHT fourth, fifth, and sixth ribs and LEFT anterior third fourth and fifth ribs suspicious for nondisplaced fractures. Review of the MIP images confirms the above findings. IMPRESSION: Probable nondisplaced fractures  of the multiple anterior ribs bilaterally. Probable nondisplaced fracture of the mid sternum. Scattered bibasilar atelectasis and minimal nonspecific ground-glass opacity RIGHT lung. No evidence of pulmonary embolism. 5 mm nonobstructing calculus upper pole RIGHT kidney. Moderate-sized hiatal hernia. Electronically Signed   By: Ulyses Southward M.D.   On: 06/09/2017 18:39    Procedures Procedures (including critical care time)  Medications Ordered in ED Medications  sodium chloride 0.9 % bolus 1,000 mL (0 mLs Intravenous Stopped 06/09/17 1844)  morphine 4 MG/ML injection 4 mg (4 mg Intravenous Given  06/09/17 1717)  iopamidol (ISOVUE-370) 76 % injection (100 mLs  Contrast Given 06/09/17 1756)  HYDROmorphone (DILAUDID) injection 1 mg (1 mg Intravenous Given 06/09/17 1845)  LORazepam (ATIVAN) injection 1 mg (1 mg Intravenous Given 06/09/17 1845)     Initial Impression / Assessment and Plan / ED Course  I have reviewed the triage vital signs and the nursing notes.  Pertinent labs & imaging results that were available during my care of the patient were reviewed by me and considered in my medical decision making (see chart for details).    [name] is a 67 y.o. female here with chest pain after chest compressions. She did get chest compressions after becoming altered after taking narcotics. I suspect pain from broken ribs from chest compressions. Will get labs, CXR. If CXR clear, may need CT as well.   7:36 PM CT showed possible nondisplaced fractures of the anterior ribs and mid sternum, no obvious PE or pneumonia. Pain controlled in the ED. Will give incentive spirometer, will recommend continue tylenol # 4 (they have prescription to be picked up from the doctor's office). Will give flexeril for muscle spasms and lidocaine patch for pain.    Final Clinical Impressions(s) / ED Diagnoses   Final diagnoses:  None    ED Discharge Orders    None       Charlynne Pander, MD 06/09/17 [phone]

## 2017-06-09 NOTE — ED Triage Notes (Signed)
Pt reports having syncopal episode 3 days ago. Pt reports ems came to her house and did chest compressions.  Pt was taken to [facility] and dc home, reports no chest xray was done. Now has severe chest pain and sob. Also reports falling 3 weeks ago and hitting her head, requesting ct scan.
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
## 2018-01-22 ENCOUNTER — Encounter: Payer: Self-pay | Admitting: Nurse Practitioner

## 2018-01-22 ENCOUNTER — Other Ambulatory Visit: Payer: Self-pay

## 2018-01-22 ENCOUNTER — Ambulatory Visit: Payer: Medicare Other | Attending: Nurse Practitioner | Admitting: Nurse Practitioner

## 2018-01-22 VITALS — BP 139/61 | HR 88 | Temp 98.4°F | Ht 60.0 in | Wt 145.0 lb

## 2018-01-22 DIAGNOSIS — M1990 Unspecified osteoarthritis, unspecified site: Secondary | ICD-10-CM | POA: Diagnosis not present

## 2018-01-22 DIAGNOSIS — Z79891 Long term (current) use of opiate analgesic: Secondary | ICD-10-CM

## 2018-01-22 DIAGNOSIS — M25561 Pain in right knee: Secondary | ICD-10-CM | POA: Diagnosis present

## 2018-01-22 DIAGNOSIS — Z789 Other specified health status: Secondary | ICD-10-CM

## 2018-01-22 DIAGNOSIS — F410 Panic disorder [episodic paroxysmal anxiety] without agoraphobia: Secondary | ICD-10-CM | POA: Insufficient documentation

## 2018-01-22 DIAGNOSIS — G894 Chronic pain syndrome: Secondary | ICD-10-CM | POA: Diagnosis not present

## 2018-01-22 DIAGNOSIS — G43009 Migraine without aura, not intractable, without status migrainosus: Secondary | ICD-10-CM | POA: Insufficient documentation

## 2018-01-22 DIAGNOSIS — F329 Major depressive disorder, single episode, unspecified: Secondary | ICD-10-CM | POA: Diagnosis not present

## 2018-01-22 DIAGNOSIS — E785 Hyperlipidemia, unspecified: Secondary | ICD-10-CM | POA: Insufficient documentation

## 2018-01-22 DIAGNOSIS — M899 Disorder of bone, unspecified: Secondary | ICD-10-CM | POA: Insufficient documentation

## 2018-01-22 DIAGNOSIS — Z79899 Other long term (current) drug therapy: Secondary | ICD-10-CM | POA: Diagnosis not present

## 2018-01-22 DIAGNOSIS — F4310 Post-traumatic stress disorder, unspecified: Secondary | ICD-10-CM | POA: Insufficient documentation

## 2018-01-22 DIAGNOSIS — D509 Iron deficiency anemia, unspecified: Secondary | ICD-10-CM | POA: Insufficient documentation

## 2018-01-22 DIAGNOSIS — G8929 Other chronic pain: Secondary | ICD-10-CM

## 2018-01-22 DIAGNOSIS — Z886 Allergy status to analgesic agent status: Secondary | ICD-10-CM | POA: Diagnosis not present

## 2018-01-22 NOTE — Progress Notes (Addendum)
Patient's Name: [name]  MRN: [id number]  Referring Provider: Alvera Singh, FNP  DOB: January 31, 1951  PCP: Alvera Singh, FNP  DOS: 01/22/2018  Note by: Dionisio David NP  Service setting: Ambulatory outpatient  Specialty: Interventional Pain Management  Location: [facility] (AMB) Pain Management Facility    Patient type: New Patient    Primary Reason(s) for Visit: Initial Patient Evaluation CC: Knee Pain (right)  HPI  [name] is a 67 y.o. year old, female patient, who comes today for an initial evaluation. She has Anemia; PTSD (post-traumatic stress disorder); Chronic arthritis; Controlled substance agreement signed; Hyperlipidemia; Influenza vaccination declined; Mammogram declined; Migraine; Panic attacks; Right knee pain; Iron deficiency anemia; Chronic pain of right knee (Primary Area of Pain); Chronic pain syndrome; Long term current use of opiate analgesic; Pharmacologic therapy; Disorder of skeletal system; and Problems influencing health status on their problem list.. Her primarily concern today is the Knee Pain (right)  Pain Assessment: Location: Right Knee Radiating: pain radiaties down the back of leg Onset: More than a month ago Duration: Chronic pain Quality: Pounding, Aching, Constant Severity: 7 /10 (subjective, self-reported pain score)  Note: Reported level is compatible with observation. Clinically the patient looks like a 1/10 A 1/10 is viewed as "Mild" and described as nagging, annoying, but not interfering with basic activities of daily living (ADL). [name] is able to eat, bathe, get dressed, do toileting (being able to get on and off the toilet and perform personal hygiene functions), transfer (move in and out of bed or a chair without assistance), and maintain continence (able to control bladder and bowel functions). Physiologic parameters such as blood pressure and heart rate apear wnl. Information on the proper use of the pain scale provided to the patient today. When  using our objective Pain Scale, levels between 6 and 10/10 are said to belong in an emergency room, as it progressively worsens from a 6/10, described as severely limiting, requiring emergency care not usually available at an outpatient pain management facility. At a 6/10 level, communication becomes difficult and requires great effort. Assistance to reach the emergency department may be required. Facial flushing and profuse sweating along with potentially dangerous increases in heart rate and blood pressure will be evident. Effect on ADL: unable to get down the stairs Timing: Constant Modifying factors: lidocaine patch, tylenol BP: 139/61  HR: 88  Onset and Duration: Date of onset: 15 years ago Cause of pain: Arthritis Severity: Getting worse, NAS-11 at its worse: 10/10, NAS-11 at its best: 5/10, NAS-11 now: 7/10 and NAS-11 on the average: 7/10 Timing: Night, Not influenced by the time of the day and wakes me up Aggravating Factors: Bending, Climbing, Kneeling, Lifiting, Motion, Prolonged standing, Squatting, Stooping , Twisting, Walking, Walking uphill and Walking downhill Alleviating Factors: Medications and lidocaine patches Associated Problems: Swelling Quality of Pain: Agonizing, Disabling, Sharp and Stabbing Previous Examinations or Tests: Bone scan, MRI scan and X-rays Previous Treatments: Narcotic medications  The patient comes into the clinics today for the first time for a chronic pain management evaluation.  According to the patient her primary area of pain is in her right knee.  She admits that she has had thyroid injections along with gel injections which were not effective.  She has been evaluated by orthopedic surgeon for right total knee replacement however secondary to anemia this was placed on hold.  She has had for iron infusions in admits that her iron is better.  She will follow-up with hematology in 10 days.  At this time they will decide if surgery is an option.  She had  physical therapy 4 to 5 months ago.  She has had recent images at [facility].  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  Today I took the time to provide the patient with information regarding this pain practice. The patient was informed that the practice is divided into two sections: an interventional pain management section, as well as a completely separate and distinct medication management section. I explained that there are procedure days for interventional therapies, and evaluation days for follow-ups and medication management. Because of the amount of documentation required during both, they are kept separated. This means that there is the possibility that she may be scheduled for a procedure on one day, and medication management the next. I have also informed her that because of staffing and facility limitations, this practice will no longer take patients for medication management only. To illustrate the reasons for this, I gave the patient the example of surgeons, and how inappropriate it would be to refer a patient to his/her care, just to write for the post-surgical antibiotics on a surgery done by a different surgeon.   Because interventional pain management is part of the board-certified specialty for the doctors, the patient was informed that joining this practice means that they are open to any and all interventional therapies. I made it clear that this does not mean that they will be forced to have any procedures done. What this means is that I believe interventional therapies to be essential part of the diagnosis and proper management of chronic pain conditions. Therefore, patients not interested in these interventional alternatives will be better served under the care of a different practitioner.  The patient was also made aware of my Comprehensive Pain Management Safety Guidelines where by joining this practice, they limit all of their nerve  blocks and joint injections to those done by our practice, for as long as we are retained to manage their care. Historic Controlled Substance Pharmacotherapy Review  PMP and historical list of controlled substances: Acetaminophen with codeine No. 4, Fioricet 50 mg, acetaminophen No. 3, oxycodone/acetaminophen 5/325 mg, diazepam 2 mg, tramadol 50 mg, hydrocodone/acetaminophen 5/325 mg, diphenoxylate/atropine 2.5/0.025 mg, clonazepam 1 mg, alprazolam 0.25 mg, Cheratussin AC liquid, Butrans 10 mcg patch, lorazepam 0.5 mg, Guiatussin AC liquid, hydrocodone/acetaminophen 10/325 mg, Highest opioid analgesic regimen found: Acetaminophen/codeine No. 4 1 tablet every 4 hours (last fill date 03/10/2017) Most recent opioid analgesic: Acetaminophen with codeine No. 4 1 tablet 3 times daily (last fill date 10/24/2017) Current opioid analgesics: None patient admitted to the use of acetaminophen with codeine this morning Highest recorded MME/day: 54 mg/day MME/day: 0 mg/day Medications: The patient did not bring the medication(s) to the appointment, as requested in our "New Patient Package" Pharmacodynamics: Desired effects: Analgesia: The patient reports >50% benefit. Reported improvement in function: The patient reports medication allows her to accomplish basic ADLs. Clinically meaningful improvement in function (CMIF): Sustained CMIF goals met Perceived effectiveness: Described as relatively effective, allowing for increase in activities of daily living (ADL) Undesirable effects: Side-effects or Adverse reactions: None reported Historical Monitoring: The patient  reports that she does not use drugs. List of all UDS Test(s): Lab Results  Component Value Date   COCAINSCRNUR NONE DETECTED 06/09/2017   THCU NONE DETECTED 06/09/2017   ETH <10 06/09/2017   List of all Serum Drug Screening Test(s):  No results found for: AMPHSCRSER, BARBSCRSER,  BENZOSCRSER, COCAINSCRSER, PCPSCRSER, PCPQUANT, THCSCRSER,  CANNABQUANT, OPIATESCRSER, OXYSCRSER, PROPOXSCRSER Historical Background Evaluation: [place] PDMP: Six (6) year initial data search conducted. Abnormal pattern detected.       [place] Department of public safety, offender search: Editor, commissioning Information) Non-contributory Risk Assessment Profile: Aberrant behavior: None observed or detected today Risk factors for fatal opioid overdose: caucasian Fatal overdose hazard ratio (HR): Calculation deferred Non-fatal overdose hazard ratio (HR): Calculation deferred Risk of opioid abuse or dependence: 0.7-3.0% with doses ? 36 MME/day and 6.1-26% with doses ? 120 MME/day. Substance use disorder (SUD) risk level: Pending results of Medical Psychology Evaluation for SUD Opioid risk tool (ORT) (Total Score): 1  ORT Scoring interpretation table:  Score <3 = Low Risk for SUD  Score between 4-7 = Moderate Risk for SUD  Score >8 = High Risk for Opioid Abuse   PHQ-2 Depression Scale:  Total score:    PHQ-2 Scoring interpretation table: (Score and probability of major depressive disorder)  Score 0 = No depression  Score 1 = 15.4% Probability  Score 2 = 21.1% Probability  Score 3 = 38.4% Probability  Score 4 = 45.5% Probability  Score 5 = 56.4% Probability  Score 6 = 78.6% Probability   PHQ-9 Depression Scale:  Total score:    PHQ-9 Scoring interpretation table:  Score 0-4 = No depression  Score 5-9 = Mild depression  Score 10-14 = Moderate depression  Score 15-19 = Moderately severe depression  Score 20-27 = Severe depression (2.4 times higher risk of SUD and 2.89 times higher risk of overuse)   Pharmacologic Plan: Pending ordered tests and/or consults  Meds  The patient has a current medication list which includes the following prescription(s): acetaminophen-codeine, diphenhydramine, lidocaine, lisinopril, loperamide, melatonin, phenazopyridine hcl, sertraline, sumatriptan, vitamin b-12, actifoam collagen sponge, atorvastatin,  butalbital-acetaminophen-caffeine, fenofibric acid, cranberry, cyclobenzaprine, donepezil, furosemide, gabapentin, niacin, and omeprazole.  Current Outpatient Medications on File Prior to Visit  Medication Sig  . acetaminophen-codeine (TYLENOL #4) 300-60 MG tablet Take by mouth.  . diphenhydrAMINE (BENADRYL) 25 mg capsule Take 25 mg by mouth.  . lidocaine (LIDODERM) 5 % Place 1 patch onto the skin daily. Remove & Discard patch within 12 hours or as directed by MD  . lisinopril (PRINIVIL,ZESTRIL) 10 MG tablet Take 10 mg by mouth.  . loperamide (IMODIUM) 2 MG capsule Take 2 mg by mouth.  . Melatonin 10 MG TABS Take 10 mg by mouth.  . Phenazopyridine HCl 97.5 MG TABS Take by mouth.  . sertraline (ZOLOFT) 50 MG tablet Take 3 po qd  . SUMAtriptan (IMITREX) 50 MG tablet Take one for migraine, may repeat dose in 2 hours if needed x1  . vitamin B-12 (CYANOCOBALAMIN) 1000 MCG tablet Take by mouth.  . Absorbable Collagen Hemostat (ACTIFOAM COLLAGEN SPONGE) MISC by Does not apply route.  Marland Kitchen atorvastatin (LIPITOR) 20 MG tablet Take 20 mg by mouth.  . butalbital-acetaminophen-caffeine (FIORICET, ESGIC) 50-325-40 MG tablet Take 1 at beginning of headache.  Must last one month.  . Choline Fenofibrate (FENOFIBRIC ACID) 135 MG CPDR Take 135 mg by mouth.  . Cranberry 500 MG CAPS Take by mouth.  . cyclobenzaprine (FLEXERIL) 5 MG tablet Take 1 tablet (5 mg total) by mouth 3 (three) times daily as needed. (Patient not taking: Reported on 01/22/2018)  . donepezil (ARICEPT) 10 MG tablet Take 10 mg by mouth.  . furosemide (LASIX) 40 MG tablet Take 40 mg by mouth.  . gabapentin (NEURONTIN) 300 MG capsule Take 2 capsules three times daily  . niacin  500 MG tablet Take 500 mg by mouth.  Marland Kitchen omeprazole (PRILOSEC) 40 MG capsule Take 40 mg by mouth.   No current facility-administered medications on file prior to visit.    Imaging Review  Knee Imaging:  Knee-R MR wo contrast:  Results for orders placed during the  hospital encounter of 07/03/11  MR Knee Right Wo Contrast   Narrative *RADIOLOGY REPORT*  Clinical Data: Right knee pain.  Internal derangement.  Knee locking and medial knee pain.  MRI OF THE RIGHT KNEE WITHOUT CONTRAST  Technique:  Multiplanar, multisequence MR imaging was performed. No intravenous contrast was administered.  Comparison: None.  Findings: Moderate suprapatellar effusion.  Extensor mechanism is intact.  Mild degenerative synovitis of the suprapatellar pouch. Moderate patellofemoral osteoarthritis is present with marginal osteophytes.  Diffuse grade III patellar chondromalacia. Subchondral edema in the patellar apex.  Complimentary trochlear chondromalacia.  The ACL is intact.  The PCL appears within normal limits.  Medial and lateral compartment moderate osteoarthritis.   The lateral collateral ligament complex is intact.  Medial collateral ligament intact.  Tiny uncomplicated Baker's cyst.  Medial meniscus shows degenerative fraying of the free edge without tear.  The lateral meniscus demonstrates a tiny horizontal tear of the midportion of the body (image 7 series 8).  IMPRESSION: 1.  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 2.  Tiny mid-body horizontal tear of the lateral meniscus.  Diffuse degenerative medial and lateral meniscal fraying. 3.  Moderate effusion and degenerative synovitis of the suprapatellar pouch.  Original Report Authenticated By: Dereck Ligas, M.D.  Note: Available results from prior imaging studies were reviewed.        ROS  Cardiovascular History: Chest pain Pulmonary or Respiratory History: No reported pulmonary signs or symptoms such as wheezing and difficulty taking a deep full breath (Asthma), difficulty blowing air out (Emphysema), coughing up mucus (Bronchitis), persistent dry cough, or temporary stoppage of breathing during sleep Neurological History: No reported neurological signs  or symptoms such as seizures, abnormal skin sensations, urinary and/or fecal incontinence, being Heidt with an abnormal open spine and/or a tethered spinal cord Review of Past Neurological Studies:  Results for orders placed or performed during the hospital encounter of 06/09/17  CT Head Wo Contrast   Narrative   CLINICAL DATA:  Patient hit head against refrigerator with loss of consciousness 2 weeks ago. Persistent pain.  EXAM: CT HEAD WITHOUT CONTRAST  TECHNIQUE: Contiguous axial images were obtained from the base of the skull through the vertex without intravenous contrast.  COMPARISON:  01/12/2015  FINDINGS: Brain: No evidence of acute infarction, hemorrhage, hydrocephalus, extra-axial collection or mass lesion/mass effect. Superficial atrophy with sulcal prominence is noted. Chronic mild-to-moderate periventricular and subcortical white matter hypodensities compatible with microvascular ischemia. Hydrocephalus. Midline fourth ventricle and basal cisterns. Cerebellum and brainstem are stable in appearance without acute appearing abnormality.  Vascular: No hyperdense vessel signs.  Skull: No skull fracture.  Sinuses/Orbits: No acute finding.  Other: No significant scalp soft tissue swelling.  IMPRESSION: 1. No acute intracranial abnormality or skull fracture. 2. 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   Electronically Signed   By: Ashley Royalty M.D.   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) Hematological History: No reported hematological signs or symptoms such as prolonged bleeding, low or poor functioning platelets, bruising or bleeding easily, hereditary bleeding problems, low energy levels due to low hemoglobin or being anemic Endocrine History: No reported endocrine signs or symptoms such as high or low blood sugar, rapid heart rate due to high thyroid levels, obesity or weight gain due to slow thyroid or thyroid disease Rheumatologic History: No reported rheumatological signs and symptoms such as fatigue, joint pain, tenderness, swelling, redness, heat, stiffness, decreased range of motion, with or without associated rash Musculoskeletal History: Negative for myasthenia gravis, muscular dystrophy, multiple sclerosis or malignant hyperthermia Work History: Retired  Allergies  [name] is allergic to aspirin and nsaids.  Laboratory Chemistry  Inflammation Markers No results found for: CRP, ESRSEDRATE (CRP: Acute Phase) (ESR: Chronic Phase) Renal Function Markers Lab Results  Component Value Date   BUN 8 06/09/2017   CREATININE 0.67 06/09/2017   GFRAA >60 06/09/2017   GFRNONAA >60 06/09/2017   Hepatic Function Markers Lab Results  Component Value Date   AST 15 06/09/2017   ALT 9 (L) 06/09/2017   ALBUMIN 3.3 (L) 06/09/2017   ALKPHOS 79 06/09/2017   Electrolytes Lab Results  Component Value Date   NA 137 06/09/2017   K 4.4 06/09/2017   CL 103 06/09/2017   CALCIUM 8.6 (L) 06/09/2017   Neuropathy Markers No results found for: [id number] Bone Pathology Markers Lab Results  Component Value Date   ALKPHOS 79 06/09/2017   CALCIUM 8.6 (L) 06/09/2017   Coagulation Parameters Lab Results  Component Value Date   INR 0.91 12/29/2010   LABPROT 12.5 12/29/2010   APTT 28 12/29/2010   PLT 296 06/09/2017   Cardiovascular Markers Lab Results  Component Value Date   HGB 8.4 (L) 06/09/2017   HCT 29.0 (L) 06/09/2017   Note: Lab results reviewed.  PFSH   Drug: [name]  reports that she does not use drugs. Alcohol:  reports that she drinks alcohol. Tobacco:  reports that she has never smoked. She has never used smokeless tobacco. Medical:  has a past medical history of Anemia, Arthritis, Depression, Hiatal hernia, Kidney stone, Migraine, and Migraines. Family: family history includes Cancer in her father; Intellectual disability in her mother.  Past Surgical History:  Procedure Laterality Date  . ABDOMINAL HYSTERECTOMY    . CESAREAN SECTION    . JOINT REPLACEMENT    . TMJ ARTHROPLASTY     Active Ambulatory Problems    Diagnosis Date Noted  . Anemia 12/02/2014  . PTSD (post-traumatic stress disorder) 04/09/2015  . Chronic arthritis 04/09/2015  . Controlled substance agreement signed 12/31/2015  . Hyperlipidemia 12/02/2014  . Influenza vaccination declined 02/09/2016  . Mammogram declined 02/09/2016  . Migraine 12/02/2014  . Panic attacks 04/09/2015  . Right knee pain 12/02/2014  . Iron deficiency anemia 01/22/2018  . Chronic pain of right knee (Primary Area of Pain) 01/22/2018  . Chronic pain syndrome 01/22/2018  . Long term current use of opiate analgesic 01/22/2018  . Pharmacologic therapy 01/22/2018  . Disorder of skeletal system 01/22/2018  . Problems influencing health status 01/22/2018   Resolved Ambulatory Problems    Diagnosis Date Noted  . No Resolved Ambulatory Problems   Past Medical History:  Diagnosis Date  . Arthritis   . Depression   . Hiatal hernia   . Kidney stone   . Migraines    Constitutional Exam  General appearance: Well nourished, well  developed, and well hydrated. In no apparent acute distress Vitals:   01/22/18 1435  BP: 139/61  Pulse: 88  Temp: 98.4 F (36.9 C)  SpO2: 100%  Weight: 145 lb (65.8 kg)  Height: 5' (1.524 m)   BMI Assessment: Estimated body mass index is 28.32 kg/m as calculated from the following:   Height as of this encounter: 5' (1.524 m).   Weight as of this  encounter: 145 lb (65.8 kg).  BMI interpretation table: BMI level Category Range association with higher incidence of chronic pain  <18 kg/m2 Underweight   18.5-24.9 kg/m2 Ideal body weight   25-29.9 kg/m2 Overweight Increased incidence by 20%  30-34.9 kg/m2 Obese (Class I) Increased incidence by 68%  35-39.9 kg/m2 Severe obesity (Class II) Increased incidence by 136%  >40 kg/m2 Extreme obesity (Class III) Increased incidence by 254%   BMI Readings from Last 4 Encounters:  01/22/18 28.32 kg/m  06/09/17 31.25 kg/m   Wt Readings from Last 4 Encounters:  01/22/18 145 lb (65.8 kg)  06/09/17 160 lb (72.6 kg)  Psych/Mental status: Alert, oriented x 3 (person, place, & time)       Eyes: PERLA Respiratory: No evidence of acute respiratory distress  Gait & Posture Assessment  Ambulation: Unassisted Gait: Relatively normal for age and body habitus Posture: WNL   Lower Extremity Exam    Side: Right lower extremity  Side: Left lower extremity  Inspection: Edema mild  Inspection: Well-healed incisional scar from previous surgery  Functional ROM: Adequate ROM          Functional ROM: Adequate ROM          Muscle strength & Tone: Functionally intact  Muscle strength & Tone: Functionally intact  Sensory: Unimpaired  Sensory: Unimpaired  Palpation: Complains of area being tender to palpation  Palpation: Non-tender   Assessment  Primary Diagnosis & Pertinent Problem List: The primary encounter diagnosis was Chronic pain of right knee (Primary Area of Pain). Diagnoses of Chronic pain syndrome, Long term current use of opiate analgesic, Pharmacologic therapy, Disorder of skeletal system, and Problems influencing health status were also pertinent to this visit.  Visit Diagnosis: 1. Chronic pain of right knee (Primary Area of Pain)   2. Chronic pain syndrome   3. Long term current use of opiate analgesic   4. Pharmacologic therapy   5. Disorder of skeletal system   6. Problems influencing  health status    Plan of Care  Initial treatment plan:  Please be advised that as per protocol, today's visit has been an evaluation only. We have not taken over the patient's controlled substance management.  Problem-specific plan: No problem-specific Assessment & Plan notes found for this encounter.  Ordered Lab-work, Procedure(s), Referral(s), & Consult(s): Orders Placed This Encounter  Procedures  . Compliance Drug Analysis, Ur  . Comp. Metabolic Panel (12)  . Magnesium  . Vitamin B12  . Sedimentation rate  . 25-Hydroxyvitamin D Lcms D2+D3  . C-reactive protein   Pharmacotherapy: Medications ordered:  No orders of the defined types were placed in this encounter.  Medications administered during this visit: Eilene Voigt had no medications administered during this visit.   Pharmacotherapy under consideration:  Opioid Analgesics: The patient was informed that there is no guarantee that she would be a candidate for opioid analgesics. The decision will be made following CDC guidelines. This decision will be based on the results of diagnostic studies, as well as [name] risk profile.  Membrane stabilizer: To be determined at a  later time Muscle relaxant: To be determined at a later time NSAID: To be determined at a later time Other analgesic(s): To be determined at a later time   Interventional therapies under consideration: [name] was informed that there is no guarantee that she would be a candidate for interventional therapies. The decision will be based on the results of diagnostic studies, as well as [name] risk profile.  Possible procedure(s): Diagnostic intra-articular right knee steroid injection Diagnostic right knee Hyalgan series Diagnostic right knee genicular nerve block Possible right knee genicular nerve radiofrequency ablation   Provider-requested follow-up: Return for 2nd Visit, w/ Dr. Dossie Arbour.  No future appointments.  Primary Care Physician:  Alvera Singh, FNP Location: [facility] Outpatient Pain Management Facility Note by:  Date: 01/22/2018; Time: 3:38 PM  Pain Score Disclaimer: We use the NRS-11 scale. This is a self-reported, subjective measurement of pain severity with only modest accuracy. It is used primarily to identify changes within a particular patient. It must be understood that outpatient pain scales are significantly less accurate that those used for research, where they can be applied under ideal controlled circumstances with minimal exposure to variables. In reality, the score is likely to be a combination of pain intensity and pain affect, where pain affect describes the degree of emotional arousal or changes in action readiness caused by the sensory experience of pain. Factors such as social and work situation, setting, emotional state, anxiety levels, expectation, and prior pain experience may influence pain perception and show large inter-individual differences that may also be affected by time variables.  Patient instructions provided during this appointment: Patient Instructions   ____________________________________________________________________________________________  Appointment Policy Summary  It is our goal and responsibility to provide the medical community with assistance in the evaluation and management of patients with chronic pain. Unfortunately our resources are limited. Because we do not have an unlimited amount of time, or available appointments, we are required to closely monitor and manage their use. The following rules exist to maximize their use:  Patient's responsibilities: 1. Punctuality:  At what time should I arrive? You should be physically present in our office 30 minutes before your scheduled appointment. Your scheduled appointment is with your assigned healthcare provider. However, it takes 5-10 minutes to be "checked-in", and another 15 minutes for the nurses to do the admission. If you  arrive to our office at the time you were given for your appointment, you will end up being at least 20-25 minutes late to your appointment with the provider. 2. Tardiness:  What happens if I arrive only a few minutes after my scheduled appointment time? You will need to reschedule your appointment. The cutoff is your appointment time. This is why it is so important that you arrive at least 30 minutes before that appointment. If you have an appointment scheduled for 10:00 AM and you arrive at 10:01, you will be required to reschedule your appointment.  3. Plan ahead:  Always assume that you will encounter traffic on your way in. Plan for it. If you are dependent on a driver, make sure they understand these rules and the need to arrive early. 4. Other appointments and responsibilities:  Avoid scheduling any other appointments before or after your pain clinic appointments.  5. Be prepared:  Write down everything that you need to discuss with your healthcare provider and give this information to the admitting nurse. Write down the medications that you will need refilled. Bring your pills and bottles (even the empty ones), to  all of your appointments, except for those where a procedure is scheduled. 6. No children or pets:  Find someone to take care of them. It is not appropriate to bring them in. 7. Scheduling changes:  We request "advanced notification" of any changes or cancellations. 8. Advanced notification:  Defined as a time period of more than 24 hours prior to the originally scheduled appointment. This allows for the appointment to be offered to other patients. 9. Rescheduling:  When a visit is rescheduled, it will require the cancellation of the original appointment. For this reason they both fall within the category of "Cancellations".  10. Cancellations:  They require advanced notification. Any cancellation less than 24 hours before the  appointment will be recorded as a "No Show". 11. No  Show:  Defined as an unkept appointment where the patient failed to notify or declare to the practice their intention or inability to keep the appointment.  Corrective process for repeat offenders:  1. Tardiness: Three (3) episodes of rescheduling due to late arrivals will be recorded as one (1) "No Show". 2. Cancellation or reschedule: Three (3) cancellations or rescheduling will be recorded as one (1) "No Show". 3. "No Shows": Three (3) "No Shows" within a 12 month period will result in discharge from the practice. ____________________________________________________________________________________________  ____________________________________________________________________________________________  Pain Scale  Introduction: The pain score used by this practice is the Verbal Numerical Rating Scale (VNRS-11). This is an 11-point scale. It is for adults and children 10 years or older. There are significant differences in how the pain score is reported, used, and applied. Forget everything you learned in the past and learn this scoring system.  General Information: The scale should reflect your current level of pain. Unless you are specifically asked for the level of your worst pain, or your average pain. If you are asked for one of these two, then it should be understood that it is over the past 24 hours.  Basic Activities of Daily Living (ADL): Personal hygiene, dressing, eating, transferring, and using restroom.  Instructions: Most patients tend to report their level of pain as a combination of two factors, their physical pain and their psychosocial pain. This last one is also known as "suffering" and it is reflection of how physical pain affects you socially and psychologically. From now on, report them separately. From this point on, when asked to report your pain level, report only your physical pain. Use the following table for reference.  Pain Clinic Pain Levels (0-5/10)  Pain Level  Score  Description  No Pain 0   Mild pain 1 Nagging, annoying, but does not interfere with basic activities of daily living (ADL). Patients are able to eat, bathe, get dressed, toileting (being able to get on and off the toilet and perform personal hygiene functions), transfer (move in and out of bed or a chair without assistance), and maintain continence (able to control bladder and bowel functions). Blood pressure and heart rate are unaffected. A normal heart rate for a healthy adult ranges from 60 to 100 bpm (beats per minute).   Mild to moderate pain 2 Noticeable and distracting. Impossible to hide from other people. More frequent flare-ups. Still possible to adapt and function close to normal. It can be very annoying and may have occasional stronger flare-ups. With discipline, patients may get used to it and adapt.   Moderate pain 3 Interferes significantly with activities of daily living (ADL). It becomes difficult to feed, bathe, get dressed, get on and off  the toilet or to perform personal hygiene functions. Difficult to get in and out of bed or a chair without assistance. Very distracting. With effort, it can be ignored when deeply involved in activities.   Moderately severe pain 4 Impossible to ignore for more than a few minutes. With effort, patients may still be able to manage work or participate in some social activities. Very difficult to concentrate. Signs of autonomic nervous system discharge are evident: dilated pupils (mydriasis); mild sweating (diaphoresis); sleep interference. Heart rate becomes elevated (>115 bpm). Diastolic blood pressure (lower number) rises above 100 mmHg. Patients find relief in laying down and not moving.   Severe pain 5 Intense and extremely unpleasant. Associated with frowning face and frequent crying. Pain overwhelms the senses.  Ability to do any activity or maintain social relationships becomes significantly limited. Conversation becomes difficult. Pacing  back and forth is common, as getting into a comfortable position is nearly impossible. Pain wakes you up from deep sleep. Physical signs will be obvious: pupillary dilation; increased sweating; goosebumps; brisk reflexes; cold, clammy hands and feet; nausea, vomiting or dry heaves; loss of appetite; significant sleep disturbance with inability to fall asleep or to remain asleep. When persistent, significant weight loss is observed due to the complete loss of appetite and sleep deprivation.  Blood pressure and heart rate becomes significantly elevated. Caution: If elevated blood pressure triggers a pounding headache associated with blurred vision, then the patient should immediately seek attention at an urgent or emergency care unit, as these may be signs of an impending stroke.    Emergency Department Pain Levels (6-10/10)  Emergency Room Pain 6 Severely limiting. Requires emergency care and should not be seen or managed at an outpatient pain management facility. Communication becomes difficult and requires great effort. Assistance to reach the emergency department may be required. Facial flushing and profuse sweating along with potentially dangerous increases in heart rate and blood pressure will be evident.   Distressing pain 7 Self-care is very difficult. Assistance is required to transport, or use restroom. Assistance to reach the emergency department will be required. Tasks requiring coordination, such as bathing and getting dressed become very difficult.   Disabling pain 8 Self-care is no longer possible. At this level, pain is disabling. The individual is unable to do even the most "basic" activities such as walking, eating, bathing, dressing, transferring to a bed, or toileting. Fine motor skills are lost. It is difficult to think clearly.   Incapacitating pain 9 Pain becomes incapacitating. Thought processing is no longer possible. Difficult to remember your own name. Control of movement and  coordination are lost.   The worst pain imaginable 10 At this level, most patients pass out from pain. When this level is reached, collapse of the autonomic nervous system occurs, leading to a sudden drop in blood pressure and heart rate. This in turn results in a temporary and dramatic drop in blood flow to the brain, leading to a loss of consciousness. Fainting is one of the body's self defense mechanisms. Passing out puts the brain in a calmed state and causes it to shut down for a while, in order to begin the healing process.    Summary: 1. Refer to this scale when providing [place] with your pain level. 2. Be accurate and careful when reporting your pain level. This will help with your care. 3. Over-reporting your pain level will lead to loss of credibility. 4. Even a level of 1/10 means that there is pain and will  be treated at our facility. 5. High, inaccurate reporting will be documented as "Symptom Exaggeration", leading to loss of credibility and suspicions of possible secondary gains such as obtaining more narcotics, or wanting to appear disabled, for fraudulent reasons. 6. Only pain levels of 5 or below will be seen at our facility. 7. Pain levels of 6 and above will be sent to the Emergency Department and the appointment cancelled. ____________________________________________________________________________________________

## 2018-01-22 NOTE — Patient Instructions (Addendum)

## 2018-01-23 ENCOUNTER — Telehealth: Payer: Self-pay

## 2018-01-23 NOTE — Telephone Encounter (Signed)
Did she have the labs completed on yesterday because they are already been sent.

## 2018-01-23 NOTE — Telephone Encounter (Signed)
She didn't want to get her labs done at [facility] due to the expense. Now she is asking if she can get it done at [facility] because she has charity care there. I told her I wasn't sure because we cant access their records. What do you think about this?

## 2018-01-24 NOTE — Telephone Encounter (Signed)
I assumed she did not because of the way she worded her message. I will find out.

## 2018-01-26 ENCOUNTER — Telehealth: Payer: Self-pay

## 2018-01-26 NOTE — Telephone Encounter (Signed)
Pt needs lab order sent to [facility] in [place], [employer] wont pay [place]. Pt has Medical Assistance with [facility]. Please call pt if you can send or they have to pick up order.

## 2018-01-27 LAB — COMPLIANCE DRUG ANALYSIS, UR

## 2018-01-29 NOTE — Telephone Encounter (Signed)
I left her a vm asking for clarification.

## 2018-01-29 NOTE — Telephone Encounter (Signed)
Patient notified that order will be faxed to [facility].

## 2018-02-06 NOTE — Progress Notes (Deleted)
Patient's Name: [name]  MRN: [id number]  Referring Provider: Alvera Singh, FNP  DOB: [birth year]  PCP: Alvera Singh, FNP  DOS: 02/07/2018  Note by: Gaspar Cola, MD  Service setting: Ambulatory outpatient  Specialty: Interventional Pain Management  Location: [facility] (AMB) Pain Management Facility    Patient type: Established   Primary Reason(s) for Visit: Encounter for evaluation before starting new chronic pain management plan of care (Level of risk: moderate) CC: No chief complaint on file.  HPI  [name] is a 67 y.o. year old, female patient, who comes today for a follow-up evaluation to review the test results and decide on a treatment plan. She has Anemia; PTSD (post-traumatic stress disorder); Chronic arthritis; Controlled substance agreement signed; Hyperlipidemia; Influenza vaccination declined; Mammogram declined; Migraine; Panic attacks; Right knee pain; Iron deficiency anemia; Chronic pain of right knee (Primary Area of Pain); Chronic pain syndrome; Long term current use of opiate analgesic; Pharmacologic therapy; Disorder of skeletal system; and Problems influencing health status on their problem list. Her primarily concern today is the No chief complaint on file.  Pain Assessment: Location:     Radiating:   Onset:   Duration:   Quality:   Severity:  /10 (subjective, self-reported pain score)  Note: Reported level is compatible with observation.                         When using our objective Pain Scale, levels between 6 and 10/10 are said to belong in an emergency room, as it progressively worsens from a 6/10, described as severely limiting, requiring emergency care not usually available at an outpatient pain management facility. At a 6/10 level, communication becomes difficult and requires great effort. Assistance to reach the emergency department may be required. Facial flushing and profuse sweating along with potentially dangerous increases in heart rate and blood  pressure will be evident. Effect on ADL:   Timing:   Modifying factors:   BP:    HR:    [name] comes in today for a follow-up visit after her initial evaluation on 01/26/2018. Today we went over the results of her tests. These were explained in "Layman's terms". During today's appointment we went over my diagnostic impression, as well as the proposed treatment plan.  ***  In considering the treatment plan options, [name] was reminded that I no longer take patients for medication management only. I asked her to let me know if she had no intention of taking advantage of the interventional therapies, so that we could make arrangements to provide this space to someone interested. I also made it clear that undergoing interventional therapies for the purpose of getting pain medications is very inappropriate on the part of a patient, and it will not be tolerated in this practice. This type of behavior would suggest true addiction and therefore it requires referral to an addiction specialist.   Further details on both, my assessment(s), as well as the proposed treatment plan, please see below.  Controlled Substance Pharmacotherapy Assessment REMS (Risk Evaluation and Mitigation Strategy)  Analgesic: ***  MME/day: *** mg/day. Pill Count: None expected due to no prior prescriptions written by our practice. No notes on file Pharmacokinetics: Liberation and absorption (onset of action): WNL Distribution (time to peak effect): WNL Metabolism and excretion (duration of action): WNL         Pharmacodynamics: Desired effects: Analgesia: [name] reports >50% benefit. Functional ability: Patient reports that medication allows her to accomplish  basic ADLs Clinically meaningful improvement in function (CMIF): Sustained CMIF goals met Perceived effectiveness: Described as relatively effective, allowing for increase in activities of daily living (ADL) Undesirable effects: Side-effects or Adverse  reactions: None reported Monitoring: [place] PMP: Online review of the past 75-monthperiod previously conducted. Not applicable at this point since we have not taken over the patient's medication management yet. List of other Serum/Urine Drug Screening Test(s):  Lab Results  Component Value Date   COCAINSCRNUR NONE DETECTED 06/09/2017   THCU NONE DETECTED 06/09/2017   ETH <10 06/09/2017   List of all UDS test(s) done:  Lab Results  Component Value Date   SUMMARY FINAL 01/22/2018   Last UDS on record: Summary  Date Value Ref Range Status  01/22/2018 FINAL  Final    Comment:    ==================================================================== TOXASSURE COMP DRUG ANALYSIS,UR ==================================================================== Test                             Result       Flag       Units Drug Present and Declared for Prescription Verification   Codeine                        >4587        EXPECTED   ng/mg creat   Morphine                       1160         EXPECTED   ng/mg creat   Normorphine                    >4587        EXPECTED   ng/mg creat   Norcodeine                     >2294        EXPECTED   ng/mg creat    Sources of codeine include scheduled prescription medications;    morphine is an expected metabolite of codeine. Other sources of    morphine include scheduled prescription medications or as a    metabolite of heroin.    Normorphine is an expected metabolite of morphine.    Norcodeine is an expected metabolite of codeine.   Hydromorphone                  28           EXPECTED   ng/mg creat   Dihydrocodeine                 92           EXPECTED   ng/mg creat   Norhydrocodone                 820          EXPECTED   ng/mg creat    The hydrocodone metabolites hydromorphone, dihydrocodeine, and    norhydrocodone are minor metabolites of codeine; concentrations    of each of these compounds rarely exceed 15% of the codeine    concentration when this is the  source.    Hydromorphone, dihydrocodeine and norhydrocodone are expected    metabolites of hydrocodone. Hydromorphone and dihydrocodeine are    also available as scheduled prescription medications.   Sertraline  PRESENT      EXPECTED   Desmethylsertraline            PRESENT      EXPECTED    Desmethylsertraline is an expected metabolite of sertraline.   Acetaminophen                  PRESENT      EXPECTED   Diphenhydramine                PRESENT      EXPECTED Drug Present not Declared for Prescription Verification   Fentanyl                       14           UNEXPECTED ng/mg creat   Norfentanyl                    155          UNEXPECTED ng/mg creat    Source of fentanyl is a scheduled prescription medication,    including IV, patch, and transmucosal formulations. Norfentanyl    is an expected metabolite of fentanyl. Drug Absent but Declared for Prescription Verification   Butalbital                     Not Detected UNEXPECTED   Gabapentin                     Not Detected UNEXPECTED   Cyclobenzaprine                Not Detected UNEXPECTED   Lidocaine                      Not Detected UNEXPECTED    Lidocaine, as indicated in the declared medication list, is not    always detected even when used as directed. ==================================================================== Test                      Result    Flag   Units      Ref Range   Creatinine              218              mg/dL      >=20 ==================================================================== Declared Medications:  The flagging and interpretation on this report are based on the  following declared medications.  Unexpected results may arise from  inaccuracies in the declared medications.  **Note: The testing scope of this panel includes these medications:  Butalbital (Fioricet)  Codeine (Tylenol 4)  Cyclobenzaprine (Flexeril)  Diphenhydramine (Benadryl)  Gabapentin (Neurontin)  Sertraline  (Zoloft)  **Note: The testing scope of this panel does not include small to  moderate amounts of these reported medications:  Acetaminophen (Fioricet)  Acetaminophen (Tylenol 4)  Lidocaine (Lidoderm)  **Note: The testing scope of this panel does not include following  reported medications:  Atorvastatin (Lipitor)  Caffeine (Fioricet)  Cranberry Extract  Cyanocobalamin  Donepezil (Aricept)  Fenofibrate  Furosemide (Lasix)  Lisinopril  Loperamide (Imodium)  Melatonin  Niacin  Omeprazole (Prilosec)  Phenazopyridine  Sumatriptan (Imitrex) ==================================================================== For clinical consultation, please call [phone]. ====================================================================    UDS interpretation: No unexpected findings.          Medication Assessment Form: Patient introduced to form today Treatment compliance: Treatment may start today if patient agrees with proposed plan. Evaluation of compliance is not  applicable at this point Risk Assessment Profile: Aberrant behavior: See initial evaluations. None observed or detected today Comorbid factors increasing risk of overdose: See initial evaluation. No additional risks detected today Opioid risk tool (ORT) (Total Score):   Personal History of Substance Abuse (SUD-Substance use disorder):  Alcohol:    Illegal Drugs:    Rx Drugs:    ORT Risk Level calculation:   Risk of substance use disorder (SUD): Low  ORT Scoring interpretation table:  Score <3 = Low Risk for SUD  Score between 4-7 = Moderate Risk for SUD  Score >8 = High Risk for Opioid Abuse   Risk Mitigation Strategies:  Patient opioid safety counseling: Completed today. Counseling provided to patient as per "Patient Counseling Document". Document signed by patient, attesting to counseling and understanding Patient-Prescriber Agreement (PPA): Obtained today.  Controlled substance notification to other providers:  Written and sent today.  Pharmacologic Plan: Today we may be taking over the patient's pharmacological regimen. See below.             Laboratory Chemistry  Inflammation Markers (CRP: Acute Phase) (ESR: Chronic Phase) No results found for: CRP, ESRSEDRATE, LATICACIDVEN                       Rheumatology Markers No results found for: RF, ANA, LABURIC, URICUR, LYMEIGGIGMAB, LYMEABIGMQN, HLAB27                      Renal Function Markers Lab Results  Component Value Date   BUN 8 06/09/2017   CREATININE 0.67 06/09/2017   GFRAA >60 06/09/2017   GFRNONAA >60 06/09/2017                             Hepatic Function Markers Lab Results  Component Value Date   AST 15 06/09/2017   ALT 9 (L) 06/09/2017   ALBUMIN 3.3 (L) 06/09/2017   ALKPHOS 79 06/09/2017                        Electrolytes Lab Results  Component Value Date   NA 137 06/09/2017   K 4.4 06/09/2017   CL 103 06/09/2017   CALCIUM 8.6 (L) 06/09/2017                        Neuropathy Markers No results found for: VITAMINB12, FOLATE, HGBA1C, HIV                      CNS Tests No results found for: COLORCSF, APPEARCSF, RBCCOUNTCSF, WBCCSF, POLYSCSF, LYMPHSCSF, EOSCSF, PROTEINCSF, GLUCCSF, JCVIRUS, CSFOLI, IGGCSF                      Bone Pathology Markers No results found for: VD25OH, [id number], [id number], [id number], 25OHVITD1, 25OHVITD2, 25OHVITD3, TESTOFREE, TESTOSTERONE                       Coagulation Parameters Lab Results  Component Value Date   INR 0.91 12/29/2010   LABPROT 12.5 12/29/2010   APTT 28 12/29/2010   PLT 296 06/09/2017                        Cardiovascular Markers Lab Results  Component Value Date   HGB 8.4 (L) 06/09/2017   HCT 29.0 (L) 06/09/2017  CA Markers No results found for: CEA, CA125, LABCA2                      Note: Lab results reviewed.  Recent Diagnostic Imaging Review  Cervical Imaging: Cervical MR wo contrast: No results found for this or any  previous visit. Cervical MR wo contrast: No procedure found. Cervical MR w/wo contrast: No results found for this or any previous visit. Cervical MR w contrast: No results found for this or any previous visit. Cervical CT wo contrast: No results found for this or any previous visit. Cervical CT w/wo contrast: No results found for this or any previous visit. Cervical CT w/wo contrast: No results found for this or any previous visit. Cervical CT w contrast: No results found for this or any previous visit. Cervical CT outside: No results found for this or any previous visit. Cervical DG 1 view: No results found for this or any previous visit. Cervical DG 2-3 views: No results found for this or any previous visit. Cervical DG F/E views: No results found for this or any previous visit. Cervical DG 2-3 clearing views: No results found for this or any previous visit. Cervical DG Bending/F/E views: No results found for this or any previous visit. Cervical DG complete: No results found for this or any previous visit. Cervical DG Myelogram views: No results found for this or any previous visit. Cervical DG Myelogram views: No results found for this or any previous visit. Cervical Discogram views: No results found for this or any previous visit.  Shoulder Imaging: Shoulder-R MR w contrast: No results found for this or any previous visit. Shoulder-L MR w contrast: No results found for this or any previous visit. Shoulder-R MR w/wo contrast: No results found for this or any previous visit. Shoulder-L MR w/wo contrast: No results found for this or any previous visit. Shoulder-R MR wo contrast: No results found for this or any previous visit. Shoulder-L MR wo contrast: No results found for this or any previous visit. Shoulder-R CT w contrast: No results found for this or any previous visit. Shoulder-L CT w contrast: No results found for this or any previous visit. Shoulder-R CT w/wo contrast: No results  found for this or any previous visit. Shoulder-L CT w/wo contrast: No results found for this or any previous visit. Shoulder-R CT wo contrast: No results found for this or any previous visit. Shoulder-L CT wo contrast: No results found for this or any previous visit. Shoulder-R DG Arthrogram: No results found for this or any previous visit. Shoulder-L DG Arthrogram: No results found for this or any previous visit. Shoulder-R DG 1 view: No results found for this or any previous visit. Shoulder-L DG 1 view: No results found for this or any previous visit. Shoulder-R DG: No results found for this or any previous visit. Shoulder-L DG: No results found for this or any previous visit.  Thoracic Imaging: Thoracic MR wo contrast: No results found for this or any previous visit. Thoracic MR wo contrast: No procedure found. Thoracic MR w/wo contrast: No results found for this or any previous visit. Thoracic MR w contrast: No results found for this or any previous visit. Thoracic CT wo contrast: No results found for this or any previous visit. Thoracic CT w/wo contrast: No results found for this or any previous visit. Thoracic CT w/wo contrast: No results found for this or any previous visit. Thoracic CT w contrast: No results found for this or any  previous visit. Thoracic DG 2-3 views: No results found for this or any previous visit. Thoracic DG 4 views: No results found for this or any previous visit. Thoracic DG: No results found for this or any previous visit. Thoracic DG w/swimmers view: No results found for this or any previous visit. Thoracic DG Myelogram views: No results found for this or any previous visit. Thoracic DG Myelogram views: No results found for this or any previous visit.  Lumbosacral Imaging: Lumbar MR wo contrast: No results found for this or any previous visit. Lumbar MR wo contrast: No procedure found. Lumbar MR w/wo contrast: No results found for this or any previous  visit. Lumbar MR w contrast: No results found for this or any previous visit. Lumbar CT wo contrast: No results found for this or any previous visit. Lumbar CT w/wo contrast: No results found for this or any previous visit. Lumbar CT w/wo contrast: No results found for this or any previous visit. Lumbar CT w contrast: No results found for this or any previous visit. Lumbar DG 1V: No results found for this or any previous visit. Lumbar DG 1V (Clearing): No results found for this or any previous visit. Lumbar DG 2-3V (Clearing): No results found for this or any previous visit. Lumbar DG 2-3 views: No results found for this or any previous visit. Lumbar DG (Complete) 4+V: No results found for this or any previous visit. Lumbar DG F/E views: No results found for this or any previous visit. Lumbar DG Bending views: No results found for this or any previous visit. Lumbar DG Myelogram views: No results found for this or any previous visit. Lumbar DG Myelogram: No results found for this or any previous visit. Lumbar DG Myelogram: No results found for this or any previous visit. Lumbar DG Myelogram: No results found for this or any previous visit. Lumbar DG Myelogram Lumbosacral: No results found for this or any previous visit. Lumbar DG Diskogram views: No results found for this or any previous visit. Lumbar DG Diskogram views: No results found for this or any previous visit. Lumbar DG Epidurogram OP: No results found for this or any previous visit. Lumbar DG Epidurogram IP: No results found for this or any previous visit.  Sacroiliac Joint Imaging: Sacroiliac Joint DG: No results found for this or any previous visit. Sacroiliac Joint MR w/wo contrast: No results found for this or any previous visit. Sacroiliac Joint MR wo contrast: No results found for this or any previous visit.  Spine Imaging: Whole Spine DG Myelogram views: No results found for this or any previous visit. Whole Spine MR  Mets screen: No results found for this or any previous visit. Whole Spine MR Mets screen: No results found for this or any previous visit. Whole Spine MR w/wo: No results found for this or any previous visit. MRA Spinal Canal w/ cm: No results found for this or any previous visit. MRA Spinal Canal wo/ cm: No procedure found. MRA Spinal Canal w/wo cm: No results found for this or any previous visit. Spine Outside MR Films: No results found for this or any previous visit. Spine Outside CT Films: No results found for this or any previous visit. CT-Guided Biopsy: No results found for this or any previous visit. CT-Guided Needle Placement: No results found for this or any previous visit. DG Spine outside: No results found for this or any previous visit. IR Spine outside: No results found for this or any previous visit. NM Spine outside: No  results found for this or any previous visit. Epidurography 1: No results found for this or any previous visit. Epidurography 2: No results found for this or any previous visit.  Hip Imaging: Hip-R MR w contrast: No results found for this or any previous visit. Hip-L MR w contrast: No results found for this or any previous visit. Hip-R MR w/wo contrast: No results found for this or any previous visit. Hip-L MR w/wo contrast: No results found for this or any previous visit. Hip-R MR wo contrast: No results found for this or any previous visit. Hip-L MR wo contrast: No results found for this or any previous visit. Hip-R CT w contrast: No results found for this or any previous visit. Hip-L CT w contrast: No results found for this or any previous visit. Hip-R CT w/wo contrast: No results found for this or any previous visit. Hip-L CT w/wo contrast: No results found for this or any previous visit. Hip-R CT wo contrast: No results found for this or any previous visit. Hip-L CT wo contrast: No results found for this or any previous visit. Hip-R DG 2-3 views: No  results found for this or any previous visit. Hip-L DG 2-3 views: No results found for this or any previous visit. Hip-R DG Arthrogram: No results found for this or any previous visit. Hip-L DG Arthrogram: No results found for this or any previous visit. Hip-B DG Bilateral: No results found for this or any previous visit.  Knee Imaging: Knee-R MR w contrast: No results found for this or any previous visit. Knee-L MR w contrast: No results found for this or any previous visit. Knee-R MR w/wo contrast: No results found for this or any previous visit. Knee-L MR w/wo contrast: No results found for this or any previous visit. Knee-R MR wo contrast:  Results for orders placed during the hospital encounter of 07/03/11  MR Knee Right Wo Contrast   Narrative *RADIOLOGY REPORT*  Clinical Data: Right knee pain.  Internal derangement.  Knee locking and medial knee pain.  MRI OF THE RIGHT KNEE WITHOUT CONTRAST  Technique:  Multiplanar, multisequence MR imaging was performed. No intravenous contrast was administered.  Comparison: None.  Findings: Moderate suprapatellar effusion.  Extensor mechanism is intact.  Mild degenerative synovitis of the suprapatellar pouch. Moderate patellofemoral osteoarthritis is present with marginal osteophytes.  Diffuse grade III patellar chondromalacia. Subchondral edema in the patellar apex.  Complimentary trochlear chondromalacia.  The ACL is intact.  The PCL appears within normal limits.  Medial and lateral compartment moderate osteoarthritis.   The lateral collateral ligament complex is intact.  Medial collateral ligament intact.  Tiny uncomplicated Baker's cyst.  Medial meniscus shows degenerative fraying of the free edge without tear.  The lateral meniscus demonstrates a tiny horizontal tear of the midportion of the body (image 7 series 8).  IMPRESSION: 1.  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 2.  Tiny  mid-body horizontal tear of the lateral meniscus.  Diffuse degenerative medial and lateral meniscal fraying. 3.  Moderate effusion and degenerative synovitis of the suprapatellar pouch.  Original Report Authenticated By: Dereck Ligas, M.D.   Knee-L MR wo contrast: No results found for this or any previous visit. Knee-R CT w contrast: No results found for this or any previous visit. Knee-L CT w contrast: No results found for this or any previous visit. Knee-R CT w/wo contrast: No results found for this or any previous visit. Knee-L CT w/wo contrast: No results found for this or any  previous visit. Knee-R CT wo contrast: No results found for this or any previous visit. Knee-L CT wo contrast: No results found for this or any previous visit. Knee-R DG 1-2 views: No results found for this or any previous visit. Knee-L DG 1-2 views: No results found for this or any previous visit. Knee-R DG 3 views: No results found for this or any previous visit. Knee-L DG 3 views: No results found for this or any previous visit. Knee-R DG 4 views: No results found for this or any previous visit. Knee-L DG 4 views: No results found for this or any previous visit. Knee-R DG Arthrogram: No results found for this or any previous visit. Knee-L DG Arthrogram: No results found for this or any previous visit.  Ankle Imaging: Ankle-R DG Complete: No results found for this or any previous visit. Ankle-L DG Complete: No results found for this or any previous visit.  Foot Imaging: Foot-R DG Complete: No results found for this or any previous visit. Foot-L DG Complete: No results found for this or any previous visit.  Elbow Imaging: Elbow-R DG Complete: No results found for this or any previous visit. Elbow-L DG Complete: No results found for this or any previous visit.  Wrist Imaging: Wrist-R DG Complete: No results found for this or any previous visit. Wrist-L DG Complete: No results found for this or any  previous visit.  Hand Imaging: Hand-R DG Complete: No results found for this or any previous visit. Hand-L DG Complete: No results found for this or any previous visit.  Complexity Note: Imaging results reviewed. Results shared with Ms. Peralta, using [employer].                         Meds   Current Outpatient Medications:  .  Absorbable Collagen Hemostat (ACTIFOAM COLLAGEN SPONGE) MISC, by Does not apply route., Disp: , Rfl:  .  acetaminophen-codeine (TYLENOL #4) 300-60 MG tablet, Take by mouth., Disp: , Rfl:  .  atorvastatin (LIPITOR) 20 MG tablet, Take 20 mg by mouth., Disp: , Rfl:  .  butalbital-acetaminophen-caffeine (FIORICET, ESGIC) 50-325-40 MG tablet, Take 1 at beginning of headache.  Must last one month., Disp: , Rfl:  .  Choline Fenofibrate (FENOFIBRIC ACID) 135 MG CPDR, Take 135 mg by mouth., Disp: , Rfl:  .  Cranberry 500 MG CAPS, Take by mouth., Disp: , Rfl:  .  cyclobenzaprine (FLEXERIL) 5 MG tablet, Take 1 tablet (5 mg total) by mouth 3 (three) times daily as needed. (Patient not taking: Reported on 01/22/2018), Disp: 15 tablet, Rfl: 0 .  diphenhydrAMINE (BENADRYL) 25 mg capsule, Take 25 mg by mouth., Disp: , Rfl:  .  donepezil (ARICEPT) 10 MG tablet, Take 10 mg by mouth., Disp: , Rfl:  .  furosemide (LASIX) 40 MG tablet, Take 40 mg by mouth., Disp: , Rfl:  .  gabapentin (NEURONTIN) 300 MG capsule, Take 2 capsules three times daily, Disp: , Rfl:  .  lidocaine (LIDODERM) 5 %, Place 1 patch onto the skin daily. Remove & Discard patch within 12 hours or as directed by MD, Disp: 6 patch, Rfl: 0 .  lisinopril (PRINIVIL,ZESTRIL) 10 MG tablet, Take 10 mg by mouth., Disp: , Rfl:  .  loperamide (IMODIUM) 2 MG capsule, Take 2 mg by mouth., Disp: , Rfl:  .  Melatonin 10 MG TABS, Take 10 mg by mouth., Disp: , Rfl:  .  niacin 500 MG tablet, Take 500 mg by mouth., Disp: ,  Rfl:  .  omeprazole (PRILOSEC) 40 MG capsule, Take 40 mg by mouth., Disp: , Rfl:  .  Phenazopyridine HCl 97.5 MG  TABS, Take by mouth., Disp: , Rfl:  .  sertraline (ZOLOFT) 50 MG tablet, Take 3 po qd, Disp: , Rfl:  .  SUMAtriptan (IMITREX) 50 MG tablet, Take one for migraine, may repeat dose in 2 hours if needed x1, Disp: , Rfl:  .  vitamin B-12 (CYANOCOBALAMIN) 1000 MCG tablet, Take by mouth., Disp: , Rfl:   ROS  Constitutional: Denies any fever or chills Gastrointestinal: No reported hemesis, hematochezia, vomiting, or acute GI distress Musculoskeletal: Denies any acute onset joint swelling, redness, loss of ROM, or weakness Neurological: No reported episodes of acute onset apraxia, aphasia, dysarthria, agnosia, amnesia, paralysis, loss of coordination, or loss of consciousness  Allergies  Ms. Albea is allergic to aspirin and nsaids.  PFSH  Drug: Ms. Leap  reports that she does not use drugs. Alcohol:  reports that she drinks alcohol. Tobacco:  reports that she has never smoked. She has never used smokeless tobacco. Medical:  has a past medical history of Anemia, Arthritis, Depression, Hiatal hernia, Kidney stone, Migraine, and Migraines. Surgical: Ms. Lanum  has a past surgical history that includes Joint replacement; Abdominal hysterectomy; TMJ Arthroplasty; and Cesarean section. Family: family history includes Cancer in her father; Intellectual disability in her mother.  Constitutional Exam  General appearance: Well nourished, well developed, and well hydrated. In no apparent acute distress There were no vitals filed for this visit. BMI Assessment: Estimated body mass index is 28.32 kg/m as calculated from the following:   Height as of 01/22/18: 5' (1.524 m).   Weight as of 01/22/18: 145 lb (65.8 kg).  BMI interpretation table: BMI level Category Range association with higher incidence of chronic pain  <18 kg/m2 Underweight   18.5-24.9 kg/m2 Ideal body weight   25-29.9 kg/m2 Overweight Increased incidence by 20%  30-34.9 kg/m2 Obese (Class I) Increased incidence by 68%  35-39.9 kg/m2 Severe  obesity (Class II) Increased incidence by 136%  >40 kg/m2 Extreme obesity (Class III) Increased incidence by 254%   Patient's current BMI Ideal Body weight  There is no height or weight on file to calculate BMI. Patient weight not recorded   BMI Readings from Last 4 Encounters:  01/22/18 28.32 kg/m  06/09/17 31.25 kg/m   Wt Readings from Last 4 Encounters:  01/22/18 145 lb (65.8 kg)  06/09/17 160 lb (72.6 kg)  Psych/Mental status: Alert, oriented x 3 (person, place, & time)       Eyes: PERLA Respiratory: No evidence of acute respiratory distress  Cervical Spine Area Exam  Skin & Axial Inspection: No masses, redness, edema, swelling, or associated skin lesions Alignment: Symmetrical Functional ROM: Unrestricted ROM      Stability: No instability detected Muscle Tone/Strength: Functionally intact. No obvious neuro-muscular anomalies detected. Sensory (Neurological): Unimpaired Palpation: No palpable anomalies              Upper Extremity (UE) Exam    Side: Right upper extremity  Side: Left upper extremity  Skin & Extremity Inspection: Skin color, temperature, and hair growth are WNL. No peripheral edema or cyanosis. No masses, redness, swelling, asymmetry, or associated skin lesions. No contractures.  Skin & Extremity Inspection: Skin color, temperature, and hair growth are WNL. No peripheral edema or cyanosis. No masses, redness, swelling, asymmetry, or associated skin lesions. No contractures.  Functional ROM: Unrestricted ROM  Functional ROM: Unrestricted ROM          Muscle Tone/Strength: Functionally intact. No obvious neuro-muscular anomalies detected.  Muscle Tone/Strength: Functionally intact. No obvious neuro-muscular anomalies detected.  Sensory (Neurological): Unimpaired          Sensory (Neurological): Unimpaired          Palpation: No palpable anomalies              Palpation: No palpable anomalies              Provocative Test(s):  Phalen's test:  deferred Tinel's test: deferred Apley's scratch test (touch opposite shoulder):  Action 1 (Across chest): deferred Action 2 (Overhead): deferred Action 3 (LB reach): deferred   Provocative Test(s):  Phalen's test: deferred Tinel's test: deferred Apley's scratch test (touch opposite shoulder):  Action 1 (Across chest): deferred Action 2 (Overhead): deferred Action 3 (LB reach): deferred    Thoracic Spine Area Exam  Skin & Axial Inspection: No masses, redness, or swelling Alignment: Symmetrical Functional ROM: Unrestricted ROM Stability: No instability detected Muscle Tone/Strength: Functionally intact. No obvious neuro-muscular anomalies detected. Sensory (Neurological): Unimpaired Muscle strength & Tone: No palpable anomalies  Lumbar Spine Area Exam  Skin & Axial Inspection: No masses, redness, or swelling Alignment: Symmetrical Functional ROM: Unrestricted ROM       Stability: No instability detected Muscle Tone/Strength: Functionally intact. No obvious neuro-muscular anomalies detected. Sensory (Neurological): Unimpaired Palpation: No palpable anomalies       Provocative Tests: Hyperextension/rotation test: deferred today       Lumbar quadrant test (Kemp's test): deferred today       Lateral bending test: deferred today       Patrick's Maneuver: deferred today                   FABER test: deferred today                   S-I anterior distraction/compression test: deferred today         S-I lateral compression test: deferred today         S-I Thigh-thrust test: deferred today         S-I Gaenslen's test: deferred today          Gait & Posture Assessment  Ambulation: Unassisted Gait: Relatively normal for age and body habitus Posture: WNL   Lower Extremity Exam    Side: Right lower extremity  Side: Left lower extremity  Stability: No instability observed          Stability: No instability observed          Skin & Extremity Inspection: Skin color, temperature, and  hair growth are WNL. No peripheral edema or cyanosis. No masses, redness, swelling, asymmetry, or associated skin lesions. No contractures.  Skin & Extremity Inspection: Skin color, temperature, and hair growth are WNL. No peripheral edema or cyanosis. No masses, redness, swelling, asymmetry, or associated skin lesions. No contractures.  Functional ROM: Unrestricted ROM                  Functional ROM: Unrestricted ROM                  Muscle Tone/Strength: Functionally intact. No obvious neuro-muscular anomalies detected.  Muscle Tone/Strength: Functionally intact. No obvious neuro-muscular anomalies detected.  Sensory (Neurological): Unimpaired medial portion of foot (L4)  Sensory (Neurological): Unimpaired medial portion of foot (L4)  DTR: Patellar: deferred today Achilles: deferred today Plantar: deferred today  DTR:  Patellar: deferred today Achilles: deferred today Plantar: deferred today  Palpation: No palpable anomalies  Palpation: No palpable anomalies   Assessment & Plan  Primary Diagnosis & Pertinent Problem List: There were no encounter diagnoses.  Visit Diagnosis: No diagnosis found. Problems updated and reviewed during this visit: No problems updated.  Plan of Care  Pharmacotherapy (Medications Ordered): No orders of the defined types were placed in this encounter.  Procedure Orders    No procedure(s) ordered today   Lab Orders  No laboratory test(s) ordered today   Imaging Orders  No imaging studies ordered today   Referral Orders  No referral(s) requested today    Pharmacological management options:  Opioid Analgesics: We'll take over management today. See above orders Membrane stabilizer: We have discussed the possibility of optimizing this mode of therapy, if tolerated Muscle relaxant: We have discussed the possibility of a trial NSAID: We have discussed the possibility of a trial Other analgesic(s): To be determined at a later time   Interventional  management options: Planned, scheduled, and/or pending:    ***   Considering:   ***   PRN Procedures:   None at this time   Provider-requested follow-up: No follow-ups on file.  Future Appointments  Date Time Provider [place]  02/07/2018  1:15 PM Milinda Pointer, MD [facility] None    Primary Care Physician: Alvera Singh, FNP Location: [facility] Note by: Gaspar Cola, MD Date: 02/07/2018; Time: 6:52 AM

## 2018-02-07 ENCOUNTER — Ambulatory Visit: Payer: Medicare Other | Admitting: Pain Medicine

## 2018-02-12 ENCOUNTER — Encounter: Payer: Self-pay | Admitting: Pain Medicine

## 2018-02-12 ENCOUNTER — Other Ambulatory Visit: Payer: Self-pay | Admitting: Pain Medicine

## 2018-02-12 DIAGNOSIS — E559 Vitamin D deficiency, unspecified: Secondary | ICD-10-CM

## 2018-02-12 DIAGNOSIS — R7989 Other specified abnormal findings of blood chemistry: Secondary | ICD-10-CM | POA: Insufficient documentation

## 2018-02-12 DIAGNOSIS — R748 Abnormal levels of other serum enzymes: Secondary | ICD-10-CM | POA: Insufficient documentation

## 2018-02-12 MED ORDER — GNP CALCIUM 1200 1200-1000 MG-UNIT PO CHEW: 1200.0000 mg | CHEWABLE_TABLET | Freq: Every day | ORAL | 5 refills | Status: AC

## 2018-02-12 MED ORDER — ERGOCALCIFEROL 1.25 MG (50000 UT) PO CAPS: 50000.0000 [IU] | ORAL_CAPSULE | ORAL | 0 refills | Status: AC

## 2018-02-12 MED ORDER — VITAMIN D3 125 MCG (5000 UT) PO CAPS: 1.0000 | ORAL_CAPSULE | Freq: Every day | ORAL | 5 refills | Status: AC

## 2018-02-12 MED ORDER — MAGNESIUM 500 MG PO CAPS: 500.0000 mg | ORAL_CAPSULE | Freq: Two times a day (BID) | ORAL | 5 refills | Status: AC

## 2018-02-25 DIAGNOSIS — M1711 Unilateral primary osteoarthritis, right knee: Secondary | ICD-10-CM | POA: Insufficient documentation

## 2018-02-25 DIAGNOSIS — Z96652 Presence of left artificial knee joint: Secondary | ICD-10-CM | POA: Insufficient documentation

## 2018-02-25 NOTE — Progress Notes (Signed)
Patient's Name: [name]  MRN: [id number]  Referring Provider: Alvera Singh, FNP  DOB: February 07, 1951  PCP: Alvera Singh, FNP  DOS: 02/26/2018  Note by: Gaspar Cola, MD  Service setting: Ambulatory outpatient  Specialty: Interventional Pain Management  Location: [facility] (AMB) Pain Management Facility    Patient type: Established   Primary Reason(s) for Visit: Encounter for evaluation before starting new chronic pain management plan of care (Level of risk: moderate) CC: Knee Pain  HPI  [name] is a 67 y.o. year old, female patient, who comes today for a follow-up evaluation to review the test results and decide on a treatment plan. She has Anemia; PTSD (post-traumatic stress disorder); Chronic arthritis; Controlled substance agreement signed; Hyperlipidemia; Influenza vaccination declined; Mammogram declined; Migraine; Panic attacks; Iron deficiency anemia; Chronic knee pain (Primary Area of Pain) (Right); Chronic pain syndrome; Long term current use of opiate analgesic; Pharmacologic therapy; Disorder of skeletal system; Problems influencing health status; Vitamin D deficiency; Hypomagnesemia; Elevated vitamin B12 level; Tricompartment osteoarthritis of knee (Right); History of total knee arthroplasty (Left); and Osteoarthritis of knee (Right) on their problem list. Her primarily concern today is the Knee Pain  Pain Assessment: Location: Right Knee Radiating: pain radiaties up to right hip Onset: More than a month ago Duration: Chronic pain Quality: Constant, Aching, Pounding Severity: 7 /10 (subjective, self-reported pain score)  Note: Reported level is inconsistent with clinical observations. Clinically the patient looks like a 3/10 A 3/10 is viewed as "Moderate" and described as significantly interfering with activities of daily living (ADL). It becomes difficult to feed, bathe, get dressed, get on and off the toilet or to perform personal hygiene functions. Difficult to get in and  out of bed or a chair without assistance. Very distracting. With effort, it can be ignored when deeply involved in activities. Information on the proper use of the pain scale provided to the patient today. When using our objective Pain Scale, levels between 6 and 10/10 are said to belong in an emergency room, as it progressively worsens from a 6/10, described as severely limiting, requiring emergency care not usually available at an outpatient pain management facility. At a 6/10 level, communication becomes difficult and requires great effort. Assistance to reach the emergency department may be required. Facial flushing and profuse sweating along with potentially dangerous increases in heart rate and blood pressure will be evident. Effect on ADL: unable to get down stairs Timing: Constant Modifying factors: lidocaine patch and medications BP: (!) 145/67  HR: 84  [name] comes in today for a follow-up visit after her initial evaluation on 01/26/2018. Today we went over the results of her tests. These were explained in "Layman's terms". During today's appointment we went over my diagnostic impression, as well as the proposed treatment plan.  According to the patient her primary area of pain is in her right knee. She admits that she has had steroid injections along with gel injections which were not effective (They helped x 3 months, then back to baseline). She has been evaluated by orthopedic surgeon [facility]) for right total knee replacement however secondary to anemia this was placed on hold.  She has had for iron infusions in admits that her iron is better.  She will follow-up with hematology.  At this time they will decide if surgery is an option.  She had physical therapy this year.  She has had recent images at [facility].  She is s/p left total knee replacement several years ago which was effective.  The patient tells me a very sad story about her husband currently having CLL and how she is taking  care of him.  In addition to this, she also indicates that she recently lost her son to a ruptured intracranial aneurysm.  The patient states that her husband is also being treated pain clinic with Tylenol #4.  The patient indicates taking Tylenol #4 herself, or for her chronic pain.  She indicates taking a maximum of 3 tablets/day (90 mg/day of codeine).  The patient's pain medication has been prescribed by his oncologist.  She indicates not being able to tolerate him being allergic to all other pain medications such as "morphine". (???)  In addition, when I reviewed the patient's PMP, there are no prescriptions for fentanyl and she was unable to tell me where this could have come from.  Because of these inconsistencies, I have informed the patient that I will not be prescribing any opioid analgesics at this time.  In reviewing her case, because she indicates not having any cartilage left in the knee, and having already failed steroid and "gel" injections, the next step would be to do a diagnostic genicular nerve block.  Should this provide the patient with good relief of the pain for the duration of the local anesthetic, it could be argued that she may be a good candidate for radiofrequency ablation.  I was very clear to the patient today that this diagnostic genicular nerve block is not meant to provide her with long-term benefit, but simply to let us know whether or not she would get any benefit from this type of therapy so that we can move onto RFA.  She understood and accepted.  In considering the treatment plan options, [name] was reminded that I no longer take patients for medication management only. I asked her to let me know if she had no intention of taking advantage of the interventional therapies, so that we could make arrangements to provide this space to someone interested. I also made it clear that undergoing interventional therapies for the purpose of getting pain medications is very  inappropriate on the part of a patient, and it will not be tolerated in this practice. This type of behavior would suggest true addiction and therefore it requires referral to an addiction specialist.   Further details on both, my assessment(s), as well as the proposed treatment plan, please see below.  Controlled Substance Pharmacotherapy Assessment REMS (Risk Evaluation and Mitigation Strategy)  Analgesic: None patient admitted to the use of acetaminophen with codeine this morning. Takes Tylenol #4 (300/60), a maximum of 3/day, according to her. (90 mg/day of codeine) Highest recorded MME/day: 54 mg/day MME/day: 0 mg/day Pill Count: None expected due to no prior prescriptions written by our practice. No notes on file Pharmacokinetics: Liberation and absorption (onset of action): WNL Distribution (time to peak effect): WNL Metabolism and excretion (duration of action): WNL         Pharmacodynamics: Desired effects: Analgesia: Ms. Ellingwood reports >50% benefit. Functional ability: Patient reports that medication allows her to accomplish basic ADLs Clinically meaningful improvement in function (CMIF): Sustained CMIF goals met Perceived effectiveness: Described as relatively effective, allowing for increase in activities of daily living (ADL) Undesirable effects: Side-effects or Adverse reactions: None reported Monitoring:  PMP: Online review of the past 14-monthperiod previously conducted. Non-compliant.  No fentanyl prescriptions found. List of other Serum/Urine Drug Screening Test(s):  Lab Results  Component Value Date   COCAINSCRNUR NONE DETECTED 06/09/2017  THCU NONE DETECTED 06/09/2017   ETH <10 06/09/2017   List of all UDS test(s) done:  Lab Results  Component Value Date   SUMMARY FINAL 01/22/2018   Last UDS on record: Summary  Date Value Ref Range Status  01/22/2018 FINAL  Final    Comment:     ==================================================================== TOXASSURE COMP DRUG ANALYSIS,UR ==================================================================== Test                             Result       Flag       Units Drug Present and Declared for Prescription Verification   Codeine                        >4587        EXPECTED   ng/mg creat   Morphine                       1160         EXPECTED   ng/mg creat   Normorphine                    >4587        EXPECTED   ng/mg creat   Norcodeine                     >2294        EXPECTED   ng/mg creat    Sources of codeine include scheduled prescription medications;    morphine is an expected metabolite of codeine. Other sources of    morphine include scheduled prescription medications or as a    metabolite of heroin.    Normorphine is an expected metabolite of morphine.    Norcodeine is an expected metabolite of codeine.   Hydromorphone                  28           EXPECTED   ng/mg creat   Dihydrocodeine                 92           EXPECTED   ng/mg creat   Norhydrocodone                 820          EXPECTED   ng/mg creat    The hydrocodone metabolites hydromorphone, dihydrocodeine, and    norhydrocodone are minor metabolites of codeine; concentrations    of each of these compounds rarely exceed 15% of the codeine    concentration when this is the source.    Hydromorphone, dihydrocodeine and norhydrocodone are expected    metabolites of hydrocodone. Hydromorphone and dihydrocodeine are    also available as scheduled prescription medications.   Sertraline                     PRESENT      EXPECTED   Desmethylsertraline            PRESENT      EXPECTED    Desmethylsertraline is an expected metabolite of sertraline.   Acetaminophen                  PRESENT      EXPECTED   Diphenhydramine  PRESENT      EXPECTED Drug Present not Declared for Prescription Verification   Fentanyl                       14            UNEXPECTED ng/mg creat   Norfentanyl                    155          UNEXPECTED ng/mg creat    Source of fentanyl is a scheduled prescription medication,    including IV, patch, and transmucosal formulations. Norfentanyl    is an expected metabolite of fentanyl. Drug Absent but Declared for Prescription Verification   Butalbital                     Not Detected UNEXPECTED   Gabapentin                     Not Detected UNEXPECTED   Cyclobenzaprine                Not Detected UNEXPECTED   Lidocaine                      Not Detected UNEXPECTED    Lidocaine, as indicated in the declared medication list, is not    always detected even when used as directed. ==================================================================== Test                      Result    Flag   Units      Ref Range   Creatinine              218              mg/dL      >=20 ==================================================================== Declared Medications:  The flagging and interpretation on this report are based on the  following declared medications.  Unexpected results may arise from  inaccuracies in the declared medications.  **Note: The testing scope of this panel includes these medications:  Butalbital (Fioricet)  Codeine (Tylenol 4)  Cyclobenzaprine (Flexeril)  Diphenhydramine (Benadryl)  Gabapentin (Neurontin)  Sertraline (Zoloft)  **Note: The testing scope of this panel does not include small to  moderate amounts of these reported medications:  Acetaminophen (Fioricet)  Acetaminophen (Tylenol 4)  Lidocaine (Lidoderm)  **Note: The testing scope of this panel does not include following  reported medications:  Atorvastatin (Lipitor)  Caffeine (Fioricet)  Cranberry Extract  Cyanocobalamin  Donepezil (Aricept)  Fenofibrate  Furosemide (Lasix)  Lisinopril  Loperamide (Imodium)  Melatonin  Niacin  Omeprazole (Prilosec)  Phenazopyridine  Sumatriptan  (Imitrex) ==================================================================== For clinical consultation, please call [phone]. ====================================================================    UDS interpretation: Unexpected findings: Undeclared substance detected (Fentanyl) Medication Assessment Form: Not applicable. Treatment compliance: Not applicable Risk Assessment Profile: Aberrant behavior: See initial evaluations. None observed or detected today Comorbid factors increasing risk of overdose: See initial evaluation. No additional risks detected today Opioid risk tool (ORT) (Total Score): 0 Personal History of Substance Abuse (SUD-Substance use disorder):  Alcohol: Negative  Illegal Drugs: Negative  Rx Drugs: Negative  ORT Risk Level calculation: Low Risk Risk of substance use disorder (SUD): Low Opioid Risk Tool - 02/26/18 1012      Family History of Substance Abuse   Alcohol  Negative    Illegal Drugs  Negative    Rx Drugs  Negative  Personal History of Substance Abuse   Alcohol  Negative    Illegal Drugs  Negative    Rx Drugs  Negative      Age   Age between 33-45 years   No      History of Preadolescent Sexual Abuse   History of Preadolescent Sexual Abuse  Negative or Female      Psychological Disease   Psychological Disease  Negative    Depression  Negative      Total Score   Opioid Risk Tool Scoring  0    Opioid Risk Interpretation  Low Risk      ORT Scoring interpretation table:  Score <3 = Low Risk for SUD  Score between 4-7 = Moderate Risk for SUD  Score >8 = High Risk for Opioid Abuse   Risk Mitigation Strategies:  Patient opioid safety counseling: Not applicable. Patient-Prescriber Agreement (PPA): No agreement signed.  Controlled substance notification to other providers: None required. No opioid therapy.  Pharmacologic Plan: No opioid analgesic prescribed.             Laboratory Chemistry  Inflammation Markers (CRP: Acute  Phase) (ESR: Chronic Phase) No results found.  Rheumatology Markers No results found.  Renal Function Markers Lab Results  Component Value Date   BUN 8 06/09/2017   CREATININE 0.67 06/09/2017   GFRAA >60 06/09/2017   GFRNONAA >60 06/09/2017                             Hepatic Function Markers Lab Results  Component Value Date   AST 15 06/09/2017   ALT 9 (L) 06/09/2017   ALBUMIN 3.3 (L) 06/09/2017   ALKPHOS 79 06/09/2017                        Electrolytes Lab Results  Component Value Date   NA 137 06/09/2017   K 4.4 06/09/2017   CL 103 06/09/2017   CALCIUM 8.6 (L) 06/09/2017                        Neuropathy Markers No results found.  CNS Tests No results found.  Bone Pathology Markers No results found.  Coagulation Parameters Lab Results  Component Value Date   INR 0.91 12/29/2010   LABPROT 12.5 12/29/2010   APTT 28 12/29/2010   PLT 296 06/09/2017                        Cardiovascular Markers Lab Results  Component Value Date   HGB 8.4 (L) 06/09/2017   HCT 29.0 (L) 06/09/2017                         CA Markers No results found.  Note: Lab results reviewed.  Recent Diagnostic Imaging Review  Knee Imaging: MHDQ-Q MR wo contrast:  Results for orders placed during the hospital encounter of 07/03/11  MR Knee Right Wo Contrast   Narrative *RADIOLOGY REPORT*  Clinical Data: Right knee pain.  Internal derangement.  Knee locking and medial knee pain.  MRI OF THE RIGHT KNEE WITHOUT CONTRAST  Technique:  Multiplanar, multisequence MR imaging was performed. No intravenous contrast was administered.  Comparison: None.  Findings: Moderate suprapatellar effusion.  Extensor mechanism is intact.  Mild degenerative synovitis of the suprapatellar pouch. Moderate patellofemoral osteoarthritis is present with marginal osteophytes.  Diffuse grade III  patellar chondromalacia. Subchondral edema in the patellar apex.  Complimentary  trochlear chondromalacia.  The ACL is intact.  The PCL appears within normal limits.  Medial and lateral compartment moderate osteoarthritis.   The lateral collateral ligament complex is intact.  Medial collateral ligament intact.  Tiny uncomplicated Baker's cyst.  Medial meniscus shows degenerative fraying of the free edge without tear.  The lateral meniscus demonstrates a tiny horizontal tear of the midportion of the body (image 7 series 8).  IMPRESSION: 1.  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 2.  Tiny mid-body horizontal tear of the lateral meniscus.  Diffuse degenerative medial and lateral meniscal fraying. 3.  Moderate effusion and degenerative synovitis of the suprapatellar pouch.  Original Report Authenticated By: Dereck Ligas, M.D.   Complexity Note: Imaging results reviewed. Results shared with Ms. Longnecker, using [employer].                         Meds   Current Outpatient Medications:  .  Absorbable Collagen Hemostat (ACTIFOAM COLLAGEN SPONGE) MISC, by Does not apply route., Disp: , Rfl:  .  acetaminophen-codeine (TYLENOL #4) 300-60 MG tablet, Take by mouth., Disp: , Rfl:  .  atorvastatin (LIPITOR) 20 MG tablet, Take 20 mg by mouth., Disp: , Rfl:  .  butalbital-acetaminophen-caffeine (FIORICET, ESGIC) 50-325-40 MG tablet, Take 1 at beginning of headache.  Must last one month., Disp: , Rfl:  .  Calcium Carbonate-Vit D-Min (GNP CALCIUM 1200) 1200-1000 MG-UNIT CHEW, Chew 1,200 mg by mouth daily with breakfast. Take in combination with vitamin D and magnesium., Disp: 30 tablet, Rfl: 5 .  Cholecalciferol (VITAMIN D3) 125 MCG (5000 UT) CAPS, Take 1 capsule (5,000 Units total) by mouth daily with breakfast. Take along with calcium and magnesium., Disp: 30 capsule, Rfl: 5 .  Choline Fenofibrate (FENOFIBRIC ACID) 135 MG CPDR, Take 135 mg by mouth., Disp: , Rfl:  .  Cranberry 500 MG CAPS, Take by mouth., Disp: , Rfl:  .   cyclobenzaprine (FLEXERIL) 5 MG tablet, Take 1 tablet (5 mg total) by mouth 3 (three) times daily as needed., Disp: 15 tablet, Rfl: 0 .  diphenhydrAMINE (BENADRYL) 25 mg capsule, Take 25 mg by mouth., Disp: , Rfl:  .  donepezil (ARICEPT) 10 MG tablet, Take 10 mg by mouth., Disp: , Rfl:  .  ergocalciferol (VITAMIN D2) 1.25 MG (50000 UT) capsule, Take 1 capsule (50,000 Units total) by mouth 2 (two) times a week. X 6 weeks., Disp: 12 capsule, Rfl: 0 .  furosemide (LASIX) 40 MG tablet, Take 40 mg by mouth., Disp: , Rfl:  .  gabapentin (NEURONTIN) 300 MG capsule, Take 2 capsules three times daily, Disp: , Rfl:  .  lidocaine (LIDODERM) 5 %, Place 1 patch onto the skin daily. Remove & Discard patch within 12 hours or as directed by MD, Disp: 6 patch, Rfl: 0 .  loperamide (IMODIUM) 2 MG capsule, Take 2 mg by mouth., Disp: , Rfl:  .  Magnesium 500 MG CAPS, Take 1 capsule (500 mg total) by mouth 2 (two) times daily at 8 am and 10 pm., Disp: 60 capsule, Rfl: 5 .  Melatonin 10 MG TABS, Take 10 mg by mouth., Disp: , Rfl:  .  niacin 500 MG tablet, Take 500 mg by mouth., Disp: , Rfl:  .  omeprazole (PRILOSEC) 40 MG capsule, Take 40 mg by mouth., Disp: , Rfl:  .  Phenazopyridine HCl 97.5 MG TABS, Take by mouth., Disp: ,  Rfl:  .  sertraline (ZOLOFT) 50 MG tablet, Take 3 po qd, Disp: , Rfl:  .  SUMAtriptan (IMITREX) 50 MG tablet, Take one for migraine, may repeat dose in 2 hours if needed x1, Disp: , Rfl:  .  vitamin B-12 (CYANOCOBALAMIN) 1000 MCG tablet, Take by mouth., Disp: , Rfl:  .  lisinopril (PRINIVIL,ZESTRIL) 10 MG tablet, Take 10 mg by mouth., Disp: , Rfl:   ROS  Constitutional: Denies any fever or chills Gastrointestinal: No reported hemesis, hematochezia, vomiting, or acute GI distress Musculoskeletal: Denies any acute onset joint swelling, redness, loss of ROM, or weakness Neurological: No reported episodes of acute onset apraxia, aphasia, dysarthria, agnosia, amnesia, paralysis, loss of  coordination, or loss of consciousness  Allergies  Ms. Potvin is allergic to aspirin and nsaids.  PFSH  Drug: Ms. Pauls  reports that she does not use drugs. Alcohol:  reports that she drinks alcohol. Tobacco:  reports that she has never smoked. She has never used smokeless tobacco. Medical:  has a past medical history of Anemia, Arthritis, Depression, Hiatal hernia, Kidney stone, Migraine, and Migraines. Surgical: Ms. Tonkovich  has a past surgical history that includes Joint replacement; Abdominal hysterectomy; TMJ Arthroplasty; and Cesarean section. Family: family history includes Cancer in her father; Intellectual disability in her mother.  Constitutional Exam  General appearance: Well nourished, well developed, and well hydrated. In no apparent acute distress Vitals:   02/26/18 1005  BP: (!) 145/67  Pulse: 84  Temp: 98.6 F (37 C)  SpO2: 97%  Weight: 152 lb (68.9 kg)  Height: 5' (1.524 m)   BMI Assessment: Estimated body mass index is 29.69 kg/m as calculated from the following:   Height as of this encounter: 5' (1.524 m).   Weight as of this encounter: 152 lb (68.9 kg).  BMI interpretation table: BMI level Category Range association with higher incidence of chronic pain  <18 kg/m2 Underweight   18.5-24.9 kg/m2 Ideal body weight   25-29.9 kg/m2 Overweight Increased incidence by 20%  30-34.9 kg/m2 Obese (Class I) Increased incidence by 68%  35-39.9 kg/m2 Severe obesity (Class II) Increased incidence by 136%  >40 kg/m2 Extreme obesity (Class III) Increased incidence by 254%   Patient's current BMI Ideal Body weight  Body mass index is 29.69 kg/m. Ideal body weight: 45.5 kg (100 lb 4.9 oz) Adjusted ideal body weight: 54.9 kg (120 lb 15.8 oz)   BMI Readings from Last 4 Encounters:  02/26/18 29.69 kg/m  01/22/18 28.32 kg/m  06/09/17 31.25 kg/m   Wt Readings from Last 4 Encounters:  02/26/18 152 lb (68.9 kg)  01/22/18 145 lb (65.8 kg)  06/09/17 160 lb (72.6 kg)   Psych/Mental status: Alert, oriented x 3 (person, place, & time)       Eyes: PERLA Respiratory: No evidence of acute respiratory distress  Cervical Spine Area Exam  Skin & Axial Inspection: No masses, redness, edema, swelling, or associated skin lesions Alignment: Symmetrical Functional ROM: Unrestricted ROM      Stability: No instability detected Muscle Tone/Strength: Functionally intact. No obvious neuro-muscular anomalies detected. Sensory (Neurological): Unimpaired Palpation: No palpable anomalies              Upper Extremity (UE) Exam    Side: Right upper extremity  Side: Left upper extremity  Skin & Extremity Inspection: Skin color, temperature, and hair growth are WNL. No peripheral edema or cyanosis. No masses, redness, swelling, asymmetry, or associated skin lesions. No contractures.  Skin & Extremity Inspection: Skin color, temperature, and  hair growth are WNL. No peripheral edema or cyanosis. No masses, redness, swelling, asymmetry, or associated skin lesions. No contractures.  Functional ROM: Unrestricted ROM          Functional ROM: Unrestricted ROM          Muscle Tone/Strength: Functionally intact. No obvious neuro-muscular anomalies detected.  Muscle Tone/Strength: Functionally intact. No obvious neuro-muscular anomalies detected.  Sensory (Neurological): Unimpaired          Sensory (Neurological): Unimpaired          Palpation: No palpable anomalies              Palpation: No palpable anomalies              Provocative Test(s):  Phalen's test: deferred Tinel's test: deferred Apley's scratch test (touch opposite shoulder):  Action 1 (Across chest): deferred Action 2 (Overhead): deferred Action 3 (LB reach): deferred   Provocative Test(s):  Phalen's test: deferred Tinel's test: deferred Apley's scratch test (touch opposite shoulder):  Action 1 (Across chest): deferred Action 2 (Overhead): deferred Action 3 (LB reach): deferred    Thoracic Spine Area Exam  Skin &  Axial Inspection: No masses, redness, or swelling Alignment: Symmetrical Functional ROM: Unrestricted ROM Stability: No instability detected Muscle Tone/Strength: Functionally intact. No obvious neuro-muscular anomalies detected. Sensory (Neurological): Unimpaired Muscle strength & Tone: No palpable anomalies  Lumbar Spine Area Exam  Skin & Axial Inspection: No masses, redness, or swelling Alignment: Symmetrical Functional ROM: Unrestricted ROM       Stability: No instability detected Muscle Tone/Strength: Functionally intact. No obvious neuro-muscular anomalies detected. Sensory (Neurological): Unimpaired Palpation: No palpable anomalies       Provocative Tests: Hyperextension/rotation test: deferred today       Lumbar quadrant test (Kemp's test): deferred today       Lateral bending test: deferred today       Patrick's Maneuver: deferred today                   FABER* test: deferred today                   S-I anterior distraction/compression test: deferred today         S-I lateral compression test: deferred today         S-I Thigh-thrust test: deferred today         S-I Gaenslen's test: deferred today         *(Flexion, ABduction and External Rotation)  Gait & Posture Assessment  Ambulation: Unassisted Gait: Relatively normal for age and body habitus Posture: WNL   Lower Extremity Exam    Side: Right lower extremity  Side: Left lower extremity  Stability: No instability observed          Stability: No instability observed          Skin & Extremity Inspection: Skin color, temperature, and hair growth are WNL. No peripheral edema or cyanosis. No masses, redness, swelling, asymmetry, or associated skin lesions. No contractures.  Skin & Extremity Inspection: Skin color, temperature, and hair growth are WNL. No peripheral edema or cyanosis. No masses, redness, swelling, asymmetry, or associated skin lesions. No contractures.  Functional ROM: Decreased ROM for knee joint           Functional ROM: Decreased ROM for knee joint          Muscle Tone/Strength: Functionally intact. No obvious neuro-muscular anomalies detected.  Muscle Tone/Strength: Functionally intact. No obvious neuro-muscular anomalies detected.  Sensory (  Neurological): Arthropathic arthralgia        Sensory (Neurological): Unimpaired        DTR: Patellar: deferred today Achilles: deferred today Plantar: deferred today  DTR: Patellar: deferred today Achilles: deferred today Plantar: deferred today  Palpation: No palpable anomalies  Palpation: No palpable anomalies   Assessment & Plan  Primary Diagnosis & Pertinent Problem List: The primary encounter diagnosis was Chronic pain syndrome. Diagnoses of Chronic knee pain (Primary Area of Pain) (Right), Tricompartment osteoarthritis of knee (Right), History of total knee arthroplasty (Left), Osteoarthritis of knee (Right), and Vitamin D deficiency were also pertinent to this visit.  Visit Diagnosis: 1. Chronic pain syndrome   2. Chronic knee pain (Primary Area of Pain) (Right)   3. Tricompartment osteoarthritis of knee (Right)   4. History of total knee arthroplasty (Left)   5. Osteoarthritis of knee (Right)   6. Vitamin D deficiency    Problems updated and reviewed during this visit: Problem  Osteoarthritis of knee (Right)  Tricompartment osteoarthritis of knee (Right)  History of total knee arthroplasty (Left)  Chronic knee pain (Primary Area of Pain) (Right)  Chronic Arthritis    Plan of Care  Pharmacotherapy (Medications Ordered): No orders of the defined types were placed in this encounter.   Procedure Orders     GENICULAR NERVE BLOCK Lab Orders  No laboratory test(s) ordered today   Imaging Orders  No imaging studies ordered today   Referral Orders  No referral(s) requested today    Pharmacological management options:  Opioid Analgesics: I will not be prescribing any opioids at this time Membrane stabilizer: We have discussed  the possibility of optimizing this mode of therapy, if tolerated Muscle relaxant: We have discussed the possibility of a trial NSAID: We have discussed the possibility of a trial Other analgesic(s): To be determined at a later time   Interventional management options: Planned, scheduled, and/or pending:    Diagnostic right knee genicular nerve block #1 under fluoroscopic guidance and IV sedation   Considering:   Diagnostic intra-articular right knee steroid injection  Diagnostic right knee Hyalgan series  Diagnostic right knee genicular nerve block  Possible right knee genicular nerve radiofrequency ablation    PRN Procedures:   None at this time   Provider-requested follow-up: Return for Procedure (w/ sedation): (R) Genicular NB #1.  No future appointments.  Primary Care Physician: Alvera Singh, FNP Location: [facility] Note by: Gaspar Cola, MD Date: 02/26/2018; Time: 12:17 PM

## 2018-02-26 ENCOUNTER — Other Ambulatory Visit: Payer: Self-pay

## 2018-02-26 ENCOUNTER — Encounter: Payer: Self-pay | Admitting: Pain Medicine

## 2018-02-26 ENCOUNTER — Ambulatory Visit: Payer: Medicare Other | Attending: Pain Medicine | Admitting: Pain Medicine

## 2018-02-26 ENCOUNTER — Ambulatory Visit: Payer: Medicare Other | Admitting: Pain Medicine

## 2018-02-26 VITALS — BP 145/67 | HR 84 | Temp 98.6°F | Ht 60.0 in | Wt 152.0 lb

## 2018-02-26 DIAGNOSIS — Z90710 Acquired absence of both cervix and uterus: Secondary | ICD-10-CM | POA: Diagnosis not present

## 2018-02-26 DIAGNOSIS — E559 Vitamin D deficiency, unspecified: Secondary | ICD-10-CM

## 2018-02-26 DIAGNOSIS — Z79891 Long term (current) use of opiate analgesic: Secondary | ICD-10-CM | POA: Insufficient documentation

## 2018-02-26 DIAGNOSIS — Z96653 Presence of artificial knee joint, bilateral: Secondary | ICD-10-CM | POA: Insufficient documentation

## 2018-02-26 DIAGNOSIS — M170 Bilateral primary osteoarthritis of knee: Secondary | ICD-10-CM | POA: Diagnosis not present

## 2018-02-26 DIAGNOSIS — M1711 Unilateral primary osteoarthritis, right knee: Secondary | ICD-10-CM

## 2018-02-26 DIAGNOSIS — G43909 Migraine, unspecified, not intractable, without status migrainosus: Secondary | ICD-10-CM | POA: Diagnosis not present

## 2018-02-26 DIAGNOSIS — G894 Chronic pain syndrome: Secondary | ICD-10-CM | POA: Diagnosis not present

## 2018-02-26 DIAGNOSIS — Z79899 Other long term (current) drug therapy: Secondary | ICD-10-CM | POA: Diagnosis not present

## 2018-02-26 DIAGNOSIS — M25561 Pain in right knee: Secondary | ICD-10-CM

## 2018-02-26 DIAGNOSIS — E785 Hyperlipidemia, unspecified: Secondary | ICD-10-CM | POA: Insufficient documentation

## 2018-02-26 DIAGNOSIS — Z96652 Presence of left artificial knee joint: Secondary | ICD-10-CM

## 2018-02-26 DIAGNOSIS — Z886 Allergy status to analgesic agent status: Secondary | ICD-10-CM | POA: Diagnosis not present

## 2018-02-26 DIAGNOSIS — Z9889 Other specified postprocedural states: Secondary | ICD-10-CM | POA: Insufficient documentation

## 2018-02-26 DIAGNOSIS — G8929 Other chronic pain: Secondary | ICD-10-CM

## 2018-02-26 DIAGNOSIS — F4312 Post-traumatic stress disorder, chronic: Secondary | ICD-10-CM | POA: Diagnosis not present

## 2018-02-26 DIAGNOSIS — D509 Iron deficiency anemia, unspecified: Secondary | ICD-10-CM | POA: Insufficient documentation

## 2018-02-26 NOTE — Patient Instructions (Addendum)
____________________________________________________________________________________________  Preparing for Procedure with Sedation  Instructions: . Oral Intake: Do not eat or drink anything for at least 8 hours prior to your procedure. . Transportation: Public transportation is not allowed. Bring an adult driver. The driver must be physically present in our waiting room before any procedure can be started. . Physical Assistance: Bring an adult physically capable of assisting you, in the event you need help. This adult should keep you company at home for at least 6 hours after the procedure. . Blood Pressure Medicine: Take your blood pressure medicine with a sip of water the morning of the procedure. . Blood thinners: Notify our staff if you are taking any blood thinners. Depending on which one you take, there will be specific instructions on how and when to stop it. . Diabetics on insulin: Notify the staff so that you can be scheduled 1st case in the morning. If your diabetes requires high dose insulin, take only  of your normal insulin dose the morning of the procedure and notify the staff that you have done so. . Preventing infections: Shower with an antibacterial soap the morning of your procedure. . Build-up your immune system: Take 1000 mg of Vitamin C with every meal (3 times a day) the day prior to your procedure. . Antibiotics: Inform the staff if you have a condition or reason that requires you to take antibiotics before dental procedures. . Pregnancy: If you are pregnant, call and cancel the procedure. . Sickness: If you have a cold, fever, or any active infections, call and cancel the procedure. . Arrival: You must be in the facility at least 30 minutes prior to your scheduled procedure. . Children: Do not bring children with you. . Dress appropriately: Bring dark clothing that you would not mind if they get stained. . Valuables: Do not bring any jewelry or valuables.  Procedure  appointments are reserved for interventional treatments only. . No Prescription Refills. . No medication changes will be discussed during procedure appointments. . No disability issues will be discussed.  Reasons to call and reschedule or cancel your procedure: (Following these recommendations will minimize the risk of a serious complication.) . Surgeries: Avoid having procedures within 2 weeks of any surgery. (Avoid for 2 weeks before or after any surgery). . Flu Shots: Avoid having procedures within 2 weeks of a flu shots or . (Avoid for 2 weeks before or after immunizations). . Barium: Avoid having a procedure within 7-10 days after having had a radiological study involving the use of radiological contrast. (Myelograms, Barium swallow or enema study). . Heart attacks: Avoid any elective procedures or surgeries for the initial 6 months after a "Myocardial Infarction" (Heart Attack). . Blood thinners: It is imperative that you stop these medications before procedures. Let us know if you if you take any blood thinner.  . Infection: Avoid procedures during or within two weeks of an infection (including chest colds or gastrointestinal problems). Symptoms associated with infections include: Localized redness, fever, chills, night sweats or profuse sweating, burning sensation when voiding, cough, congestion, stuffiness, runny nose, sore throat, diarrhea, nausea, vomiting, cold or Flu symptoms, recent or current infections. It is specially important if the infection is over the area that we intend to treat. . Heart and lung problems: Symptoms that may suggest an active cardiopulmonary problem include: cough, chest pain, breathing difficulties or shortness of breath, dizziness, ankle swelling, uncontrolled high or unusually low blood pressure, and/or palpitations. If you are experiencing any of these symptoms, cancel   your procedure and contact your primary care physician for an evaluation.  Remember:   Regular Business hours are:  Monday to Thursday 8:00 AM to 4:00 PM  Provider's Schedule: Gailene Youkhana, MD:  Procedure days: Tuesday and Thursday 7:30 AM to 4:00 PM  Bilal Lateef, MD:  Procedure days: Monday and Wednesday 7:30 AM to 4:00 PM ____________________________________________________________________________________________    ______________________________________________________________________________________________  Specialty Pain Scale  Introduction:  There are significant differences in how pain is reported. The word pain usually refers to physical pain, but it is also a common synonym of suffering. The medical community uses a scale from 0 (zero) to 10 (ten) to report pain level. Zero (0) is described as "no pain", while ten (10) is described as "the worse pain you can imagine". The problem with this scale is that physical pain is reported along with suffering. Suffering refers to mental pain, or more often yet it refers to any unpleasant feeling, emotion or aversion associated with the perception of harm or threat of harm. It is the psychological component of pain.  Pain Specialists prefer to separate the two components. The pain scale used by this practice is the Verbal Numerical Rating Scale (VNRS-11). This scale is for the physical pain only. DO NOT INCLUDE how your pain psychologically affects you. This scale is for adults 21 years of age and older. It has 11 (eleven) levels. The 1st level is 0/10. This means: "right now, I have no pain". In the context of pain management, it also means: "right now, my physical pain is under control with the current therapy".  General Information:  The scale should reflect your current level of pain. Unless you are specifically asked for the level of your worst pain, or your average pain. If you are asked for one of these two, then it should be understood that it is over the past 24 hours.  Levels 1 (one) through 5 (five) are  described below, and can be treated as an outpatient. Ambulatory pain management facilities such as ours are more than adequate to treat these levels. Levels 6 (six) through 10 (ten) are also described below, however, these must be treated as a hospitalized patient. While levels 6 (six) and 7 (seven) may be evaluated at an urgent care facility, levels 8 (eight) through 10 (ten) constitute medical emergencies and as such, they belong in a hospital's emergency department. When having these levels (as described below), do not come to our office. Our facility is not equipped to manage these levels. Go directly to an urgent care facility or an emergency department to be evaluated.  Definitions:  Activities of Daily Living (ADL): Activities of daily living (ADL or ADLs) is a term used in healthcare to refer to people's daily self-care activities. Health professionals often use a person's ability or inability to perform ADLs as a measurement of their functional status, particularly in regard to people post injury, with disabilities and the elderly. There are two ADL levels: Basic and Instrumental. Basic Activities of Daily Living (BADL  or BADLs) consist of self-care tasks that include: Bathing and showering; personal hygiene and grooming (including brushing/combing/styling hair); dressing; Toilet hygiene (getting to the toilet, cleaning oneself, and getting back up); eating and self-feeding (not including cooking or chewing and swallowing); functional mobility, often referred to as "transferring", as measured by the ability to walk, get in and out of bed, and get into and out of a chair; the broader definition (moving from one place to another while performing activities)   is useful for people with different physical abilities who are still able to get around independently. Basic ADLs include the things many people do when they get up in the morning and get ready to go out of the house: get out of bed, go to the  toilet, bathe, dress, groom, and eat. On the average, loss of function typically follows a particular order. Hygiene is the first to go, followed by loss of toilet use and locomotion. The last to go is the ability to eat. When there is only one remaining area in which the person is independent, there is a 62.9% chance that it is eating and only a 3.5% chance that it is hygiene. Instrumental Activities of Daily Living (IADL or IADLs) are not necessary for fundamental functioning, but they let an individual live independently in a community. IADL consist of tasks that include: cleaning and maintaining the house; home establishment and maintenance; care of others (including selecting and supervising caregivers); care of pets; child rearing; managing money; managing financials (investments, etc.); meal preparation and cleanup; shopping for groceries and necessities; moving within the community; safety procedures and emergency responses; health management and maintenance (taking prescribed medications); and using the telephone or other form of communication.  Instructions:  Most patients tend to report their pain as a combination of two factors, their physical pain and their psychosocial pain. This last one is also known as "suffering" and it is reflection of how physical pain affects you socially and psychologically. From now on, report them separately.  From this point on, when asked to report your pain level, report only your physical pain. Use the following table for reference.  Pain Clinic Pain Levels (0-5/10)  Pain Level Score  Description  No Pain 0   Mild pain 1 Nagging, annoying, but does not interfere with basic activities of daily living (ADL). Patients are able to eat, bathe, get dressed, toileting (being able to get on and off the toilet and perform personal hygiene functions), transfer (move in and out of bed or a chair without assistance), and maintain continence (able to control bladder and  bowel functions). Blood pressure and heart rate are unaffected. A normal heart rate for a healthy adult ranges from 60 to 100 bpm (beats per minute).   Mild to moderate pain 2 Noticeable and distracting. Impossible to hide from other people. More frequent flare-ups. Still possible to adapt and function close to normal. It can be very annoying and may have occasional stronger flare-ups. With discipline, patients may get used to it and adapt.   Moderate pain 3 Interferes significantly with activities of daily living (ADL). It becomes difficult to feed, bathe, get dressed, get on and off the toilet or to perform personal hygiene functions. Difficult to get in and out of bed or a chair without assistance. Very distracting. With effort, it can be ignored when deeply involved in activities.   Moderately severe pain 4 Impossible to ignore for more than a few minutes. With effort, patients may still be able to manage work or participate in some social activities. Very difficult to concentrate. Signs of autonomic nervous system discharge are evident: dilated pupils (mydriasis); mild sweating (diaphoresis); sleep interference. Heart rate becomes elevated (>115 bpm). Diastolic blood pressure (lower number) rises above 100 mmHg. Patients find relief in laying down and not moving.   Severe pain 5 Intense and extremely unpleasant. Associated with frowning face and frequent crying. Pain overwhelms the senses.  Ability to do any activity or maintain social   relationships becomes significantly limited. Conversation becomes difficult. Pacing back and forth is common, as getting into a comfortable position is nearly impossible. Pain wakes you up from deep sleep. Physical signs will be obvious: pupillary dilation; increased sweating; goosebumps; brisk reflexes; cold, clammy hands and feet; nausea, vomiting or dry heaves; loss of appetite; significant sleep disturbance with inability to fall asleep or to remain asleep. When  persistent, significant weight loss is observed due to the complete loss of appetite and sleep deprivation.  Blood pressure and heart rate becomes significantly elevated. Caution: If elevated blood pressure triggers a pounding headache associated with blurred vision, then the patient should immediately seek attention at an urgent or emergency care unit, as these may be signs of an impending stroke.    Emergency Department Pain Levels (6-10/10)  Emergency Room Pain 6 Severely limiting. Requires emergency care and should not be seen or managed at an outpatient pain management facility. Communication becomes difficult and requires great effort. Assistance to reach the emergency department may be required. Facial flushing and profuse sweating along with potentially dangerous increases in heart rate and blood pressure will be evident.   Distressing pain 7 Self-care is very difficult. Assistance is required to transport, or use restroom. Assistance to reach the emergency department will be required. Tasks requiring coordination, such as bathing and getting dressed become very difficult.   Disabling pain 8 Self-care is no longer possible. At this level, pain is disabling. The individual is unable to do even the most "basic" activities such as walking, eating, bathing, dressing, transferring to a bed, or toileting. Fine motor skills are lost. It is difficult to think clearly.   Incapacitating pain 9 Pain becomes incapacitating. Thought processing is no longer possible. Difficult to remember your own name. Control of movement and coordination are lost.   The worst pain imaginable 10 At this level, most patients pass out from pain. When this level is reached, collapse of the autonomic nervous system occurs, leading to a sudden drop in blood pressure and heart rate. This in turn results in a temporary and dramatic drop in blood flow to the brain, leading to a loss of consciousness. Fainting is one of the body's  self defense mechanisms. Passing out puts the brain in a calmed state and causes it to shut down for a while, in order to begin the healing process.    Summary: 1. Refer to this scale when providing us with your pain level. 2. Be accurate and careful when reporting your pain level. This will help with your care. 3. Over-reporting your pain level will lead to loss of credibility. 4. Even a level of 1/10 means that there is pain and will be treated at our facility. 5. High, inaccurate reporting will be documented as "Symptom Exaggeration", leading to loss of credibility and suspicions of possible secondary gains such as obtaining more narcotics, or wanting to appear disabled, for fraudulent reasons. 6. Only pain levels of 5 or below will be seen at our facility. 7. Pain levels of 6 and above will be sent to the Emergency Department and the appointment cancelled. ______________________________________________________________________________________________  ____________________________________________________________________________________________  General Risks and Possible Complications  Patient Responsibilities: It is important that you read this as it is part of your informed consent. It is our duty to inform you of the risks and possible complications associated with treatments offered to you. It is your responsibility as a patient to read this and to ask questions about anything that is not clear or   that you believe was not covered in this document.  Patient's Rights: You have the right to refuse treatment. You also have the right to change your mind, even after initially having agreed to have the treatment done. However, under this last option, if you wait until the last second to change your mind, you may be charged for the materials used up to that point.  Introduction: Medicine is not an exact science. Everything in Medicine, including the lack of treatment(s), carries the potential for  danger, harm, or loss (which is by definition: Risk). In Medicine, a complication is a secondary problem, condition, or disease that can aggravate an already existing one. All treatments carry the risk of possible complications. The fact that a side effects or complications occurs, does not imply that the treatment was conducted incorrectly. It must be clearly understood that these can happen even when everything is done following the highest safety standards.  No treatment: You can choose not to proceed with the proposed treatment alternative. The "PRO(s)" would include: avoiding the risk of complications associated with the therapy. The "CON(s)" would include: not getting any of the treatment benefits. These benefits fall under one of three categories: diagnostic; therapeutic; and/or palliative. Diagnostic benefits include: getting information which can ultimately lead to improvement of the disease or symptom(s). Therapeutic benefits are those associated with the successful treatment of the disease. Finally, palliative benefits are those related to the decrease of the primary symptoms, without necessarily curing the condition (example: decreasing the pain from a flare-up of a chronic condition, such as incurable terminal cancer).  General Risks and Complications: These are associated to most interventional treatments. They can occur alone, or in combination. They fall under one of the following six (6) categories: no benefit or worsening of symptoms; bleeding; infection; nerve damage; allergic reactions; and/or death. 1. No benefits or worsening of symptoms: In Medicine there are no guarantees, only probabilities. No healthcare provider can ever guarantee that a medical treatment will work, they can only state the probability that it may. Furthermore, there is always the possibility that the condition may worsen, either directly, or indirectly, as a consequence of the treatment. 2. Bleeding: This is more  common if the patient is taking a blood thinner, either prescription or over the counter (example: Goody Powders, Fish oil, Aspirin, Garlic, etc.), or if suffering a condition associated with impaired coagulation (example: Hemophilia, cirrhosis of the liver, low platelet counts, etc.). However, even if you do not have one on these, it can still happen. If you have any of these conditions, or take one of these drugs, make sure to notify your treating physician. 3. Infection: This is more common in patients with a compromised immune system, either due to disease (example: diabetes, cancer, human immunodeficiency virus [HIV], etc.), or due to medications or treatments (example: therapies used to treat cancer and rheumatological diseases). However, even if you do not have one on these, it can still happen. If you have any of these conditions, or take one of these drugs, make sure to notify your treating physician. 4. Nerve Damage: This is more common when the treatment is an invasive one, but it can also happen with the use of medications, such as those used in the treatment of cancer. The damage can occur to small secondary nerves, or to large primary ones, such as those in the spinal cord and brain. This damage may be temporary or permanent and it may lead to impairments that can range from temporary   numbness to permanent paralysis and/or brain death. 5. Allergic Reactions: Any time a substance or material comes in contact with our body, there is the possibility of an allergic reaction. These can range from a mild skin rash (contact dermatitis) to a severe systemic reaction (anaphylactic reaction), which can result in death. 6. Death: In general, any medical intervention can result in death, most of the time due to an unforeseen complication. ____________________________________________________________________________________________    

## 2020-01-27 IMAGING — DX DG CHEST 2V
2 series · 2 of 2 positions shown · non-contrast
Comparison: 04/26/2017.

CLINICAL DATA: Syncopal episode.

EXAM:
CHEST - 2 VIEW

[chest lat]
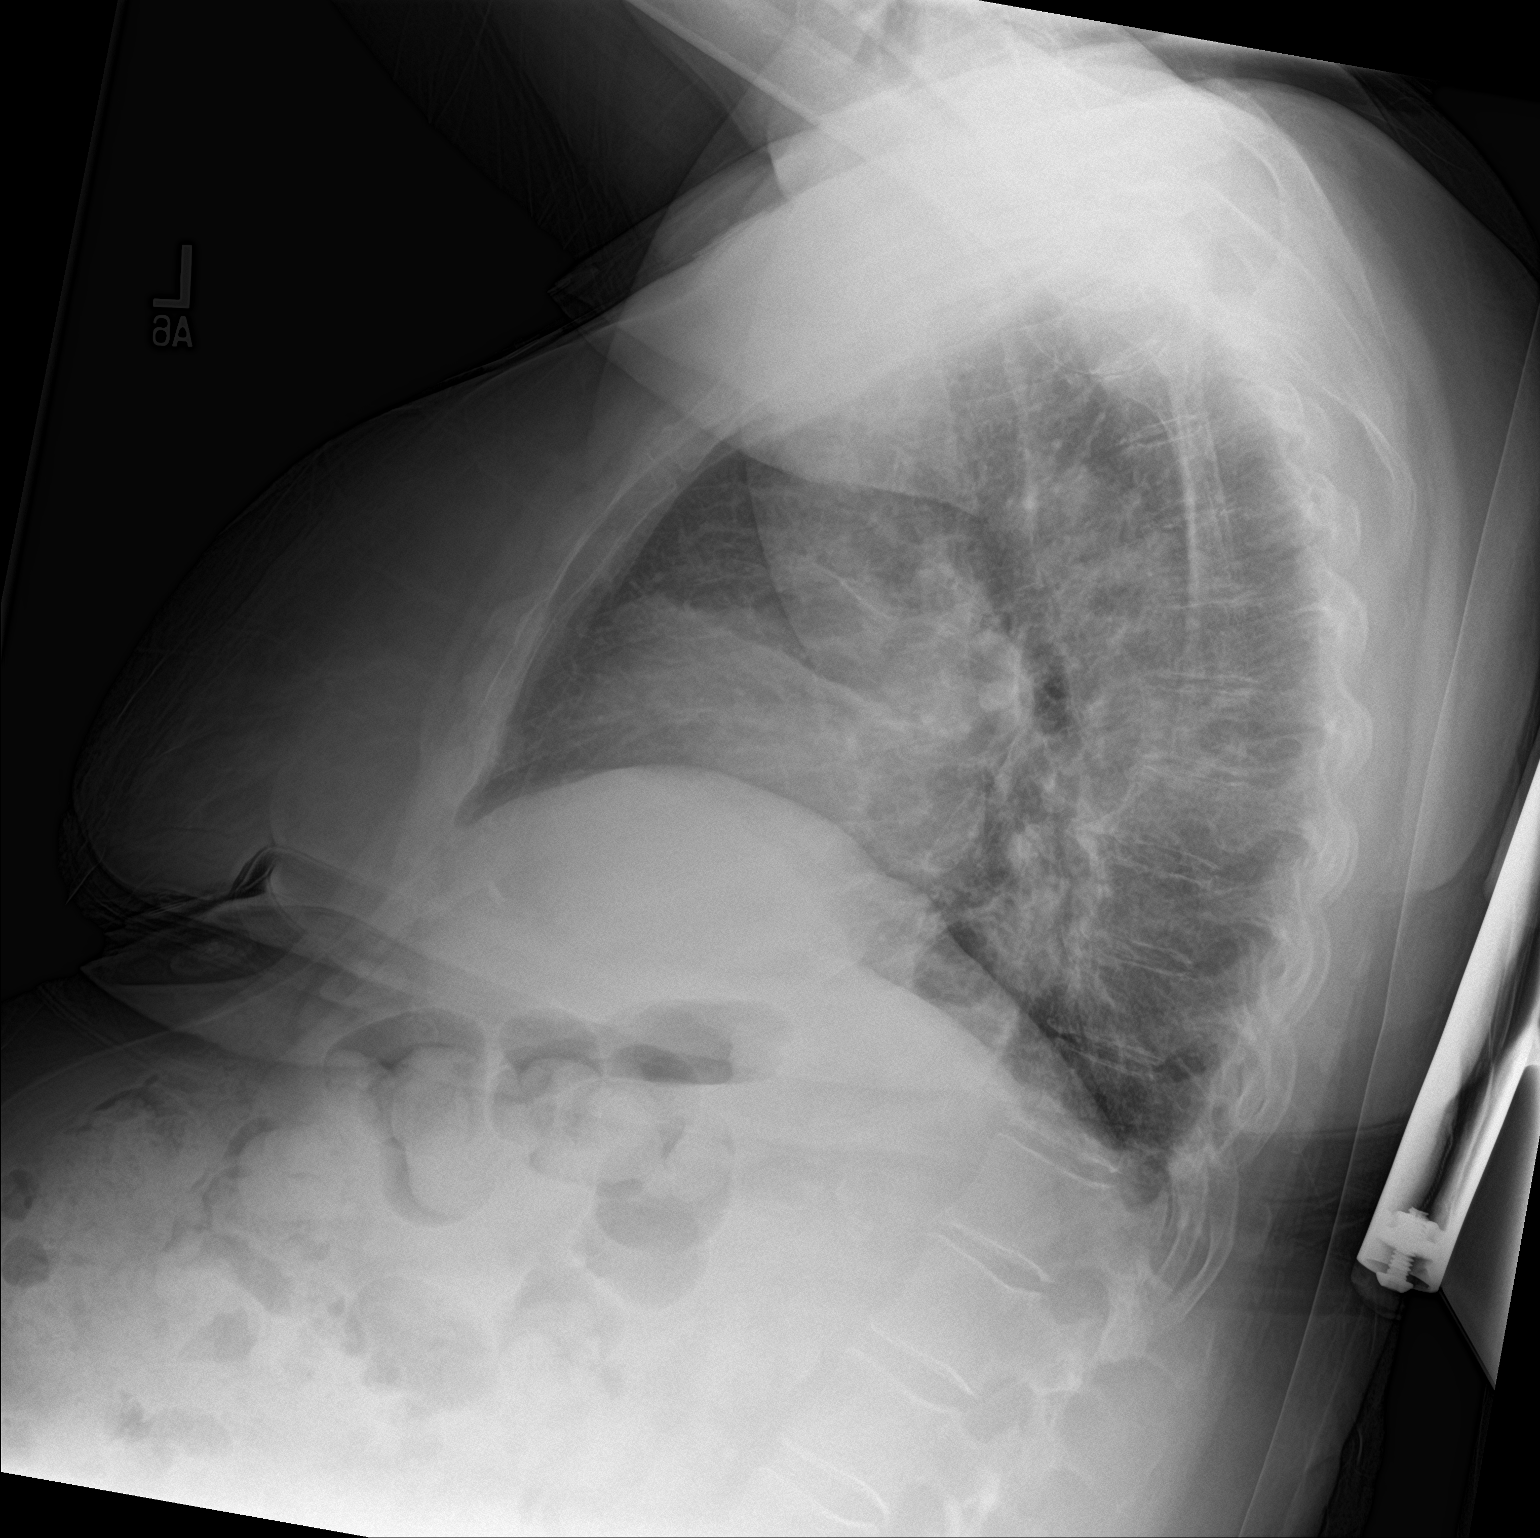

[chest ap]
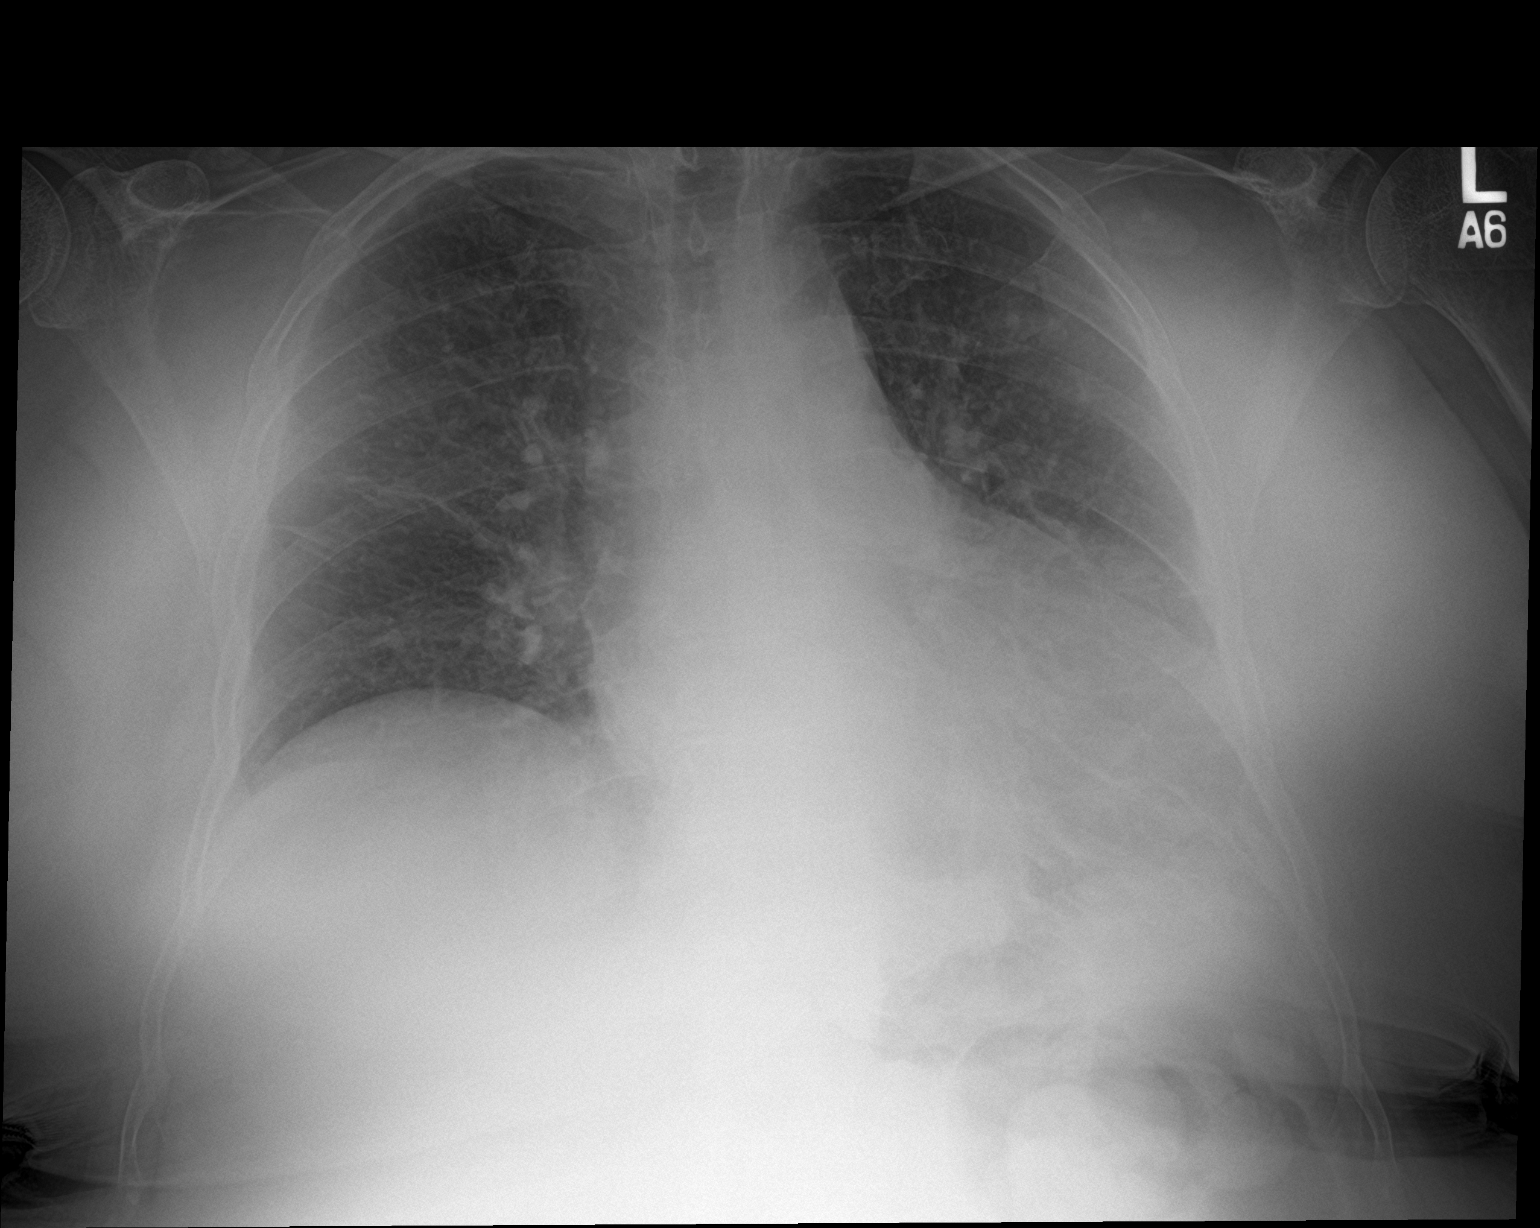

[2 of 2 positions shown; findings below may reference images not displayed]

FINDINGS: Mediastinum and hilar structures normal. Cardiomegaly with normal
pulmonary vascularity. Mild bilateral subsegmental atelectasis. No
pleural effusion or pneumothorax. Degenerative change thoracic
spine.
IMPRESSION: 1.  Cardiomegaly.  No pulmonary venous congestion.

2.  Mild bilateral subsegmental atelectasis.

## 2020-01-27 IMAGING — CT CT HEAD W/O CM
4 series · 16 of 47 positions shown, 18 images · non-contrast
Comparison: 01/12/2015

CLINICAL DATA: Patient hit head against refrigerator with loss of
consciousness 2 weeks ago. Persistent pain.

EXAM:
CT HEAD WITHOUT CONTRAST
TECHNIQUE: Contiguous axial images were obtained from the base of the skull
through the vertex without intravenous contrast.

[Series 3: head wo · axial · 0.41mm/px · z∈[-156,-42]mm · 7 of 31 slices shown, 9 images]
[im 4/31  brain]
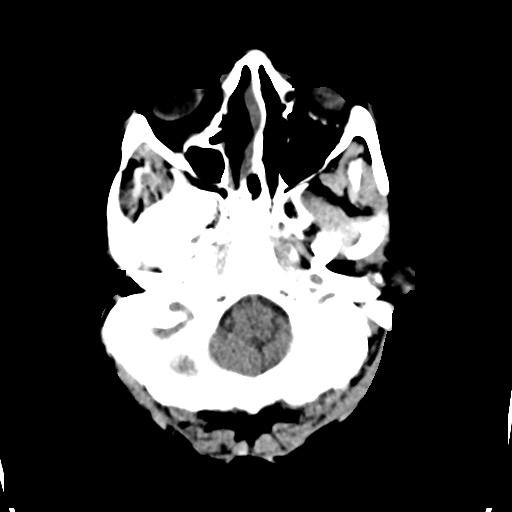
[im 4/31  bone]
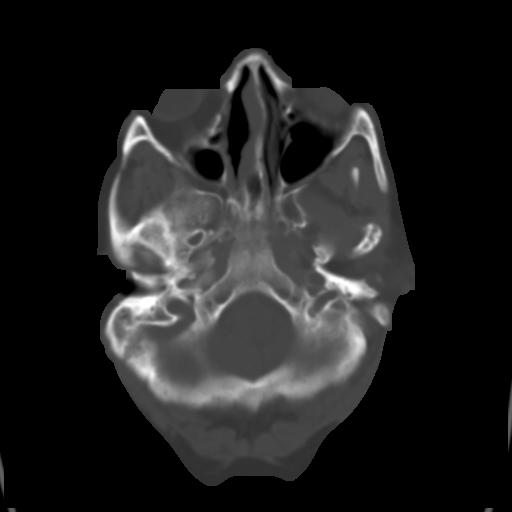
[im 8/31  brain]
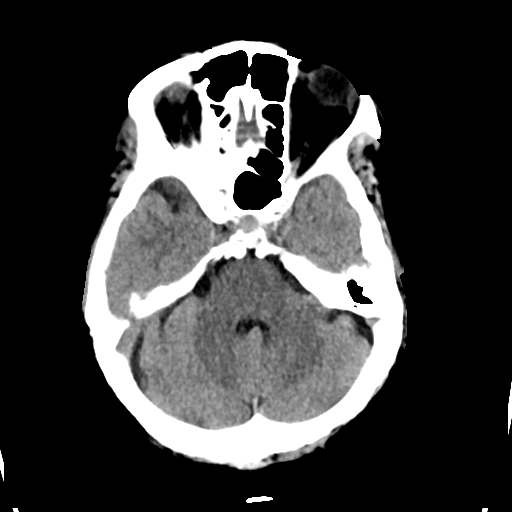
[im 12/31  brain]
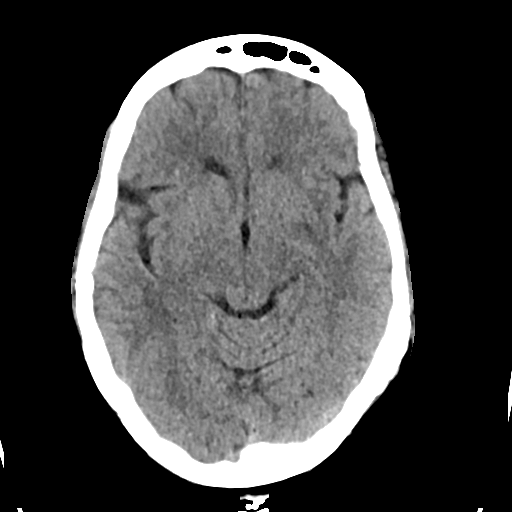
[im 16/31  brain]
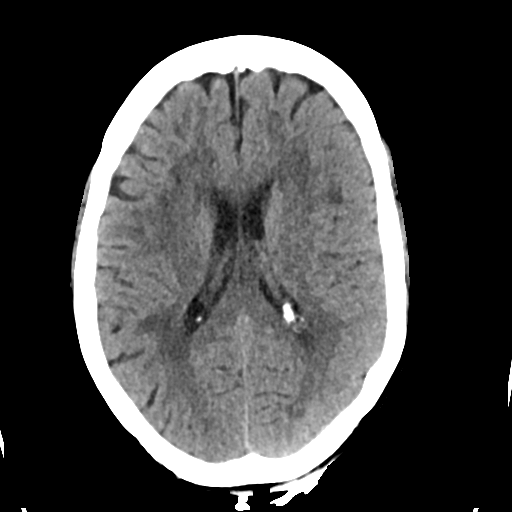
[im 19/31  brain]
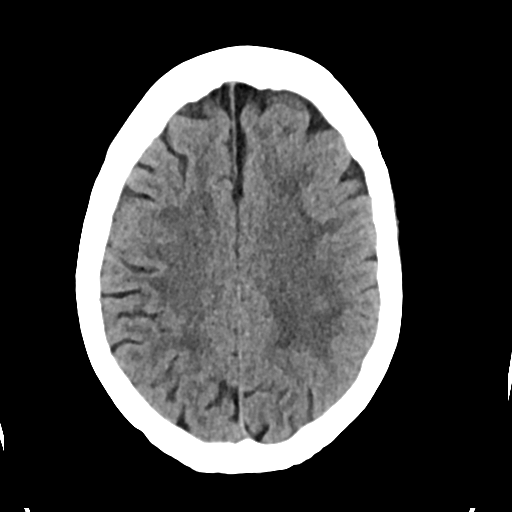
[im 19/31  bone]
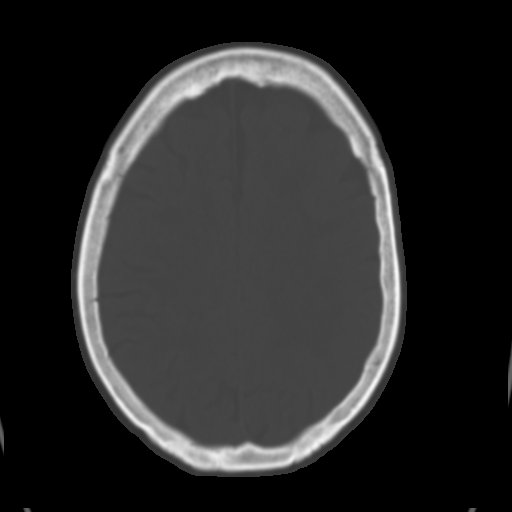
[im 23/31  brain]
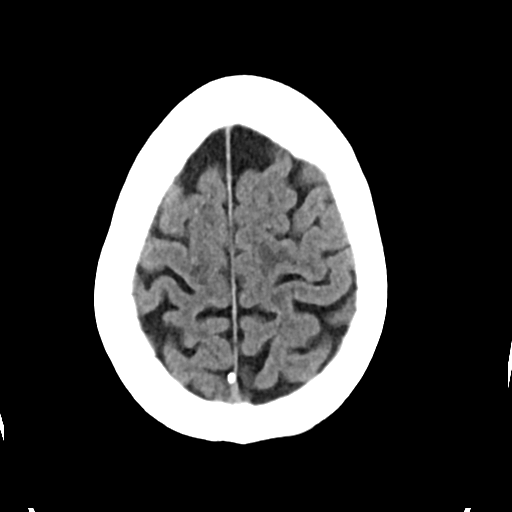
[im 27/31  brain]
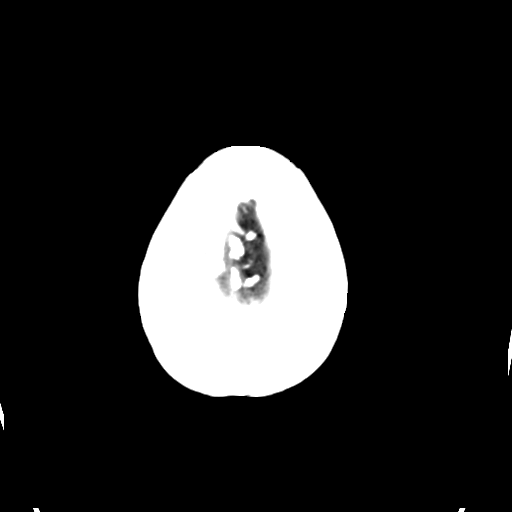

[Series 4: head bone · axial · 0.41mm/px · z∈[-158,-128]mm · 3 of 77 slices shown]
[im 8/77  bone]
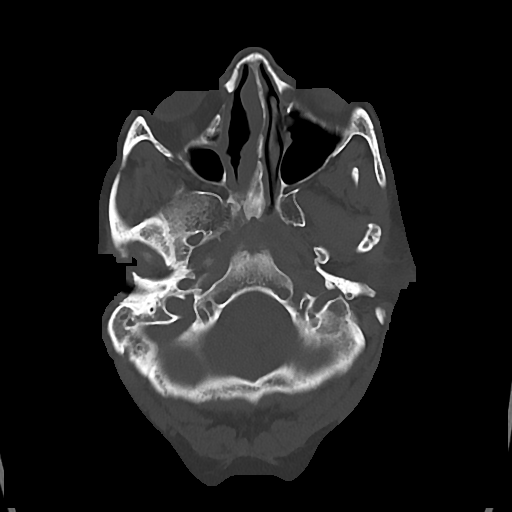
[im 16/77  bone]
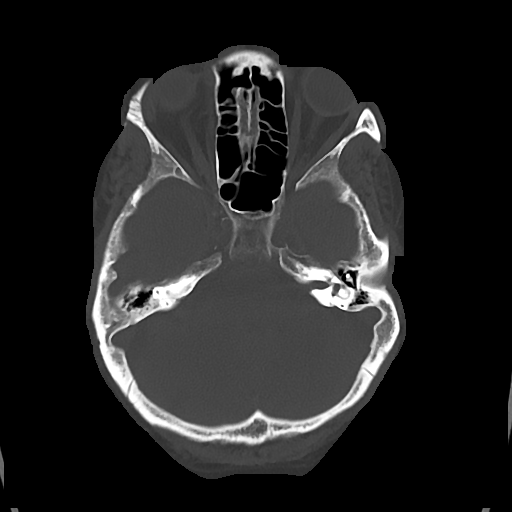
[im 23/77  bone]
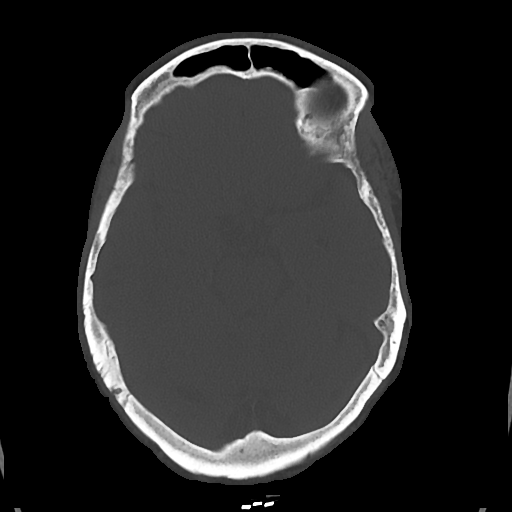

[Series 5: cor soft · coronal · 0.30mm/px · 3 of 67 slices shown]
[im 23/67  brain]
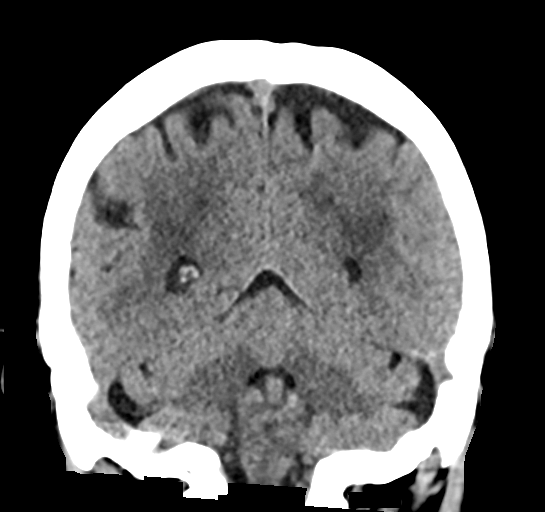
[im 30/67  brain]
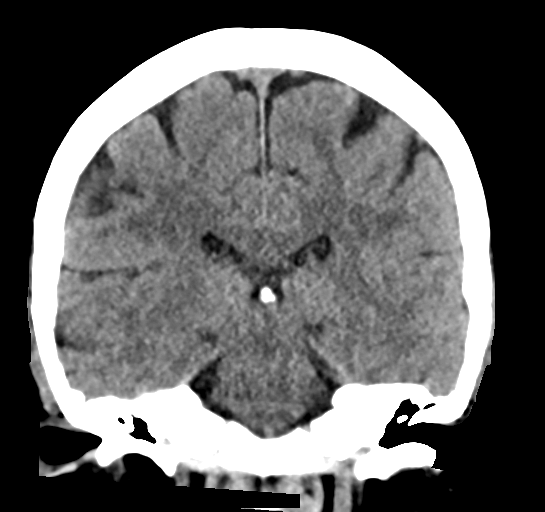
[im 37/67  brain]
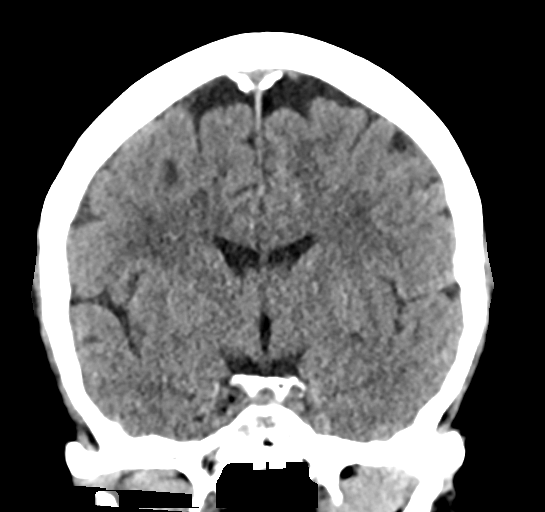

[Series 6: sag soft · sagittal · 0.30mm/px · 3 of 51 slices shown]
[im 17/51  brain]
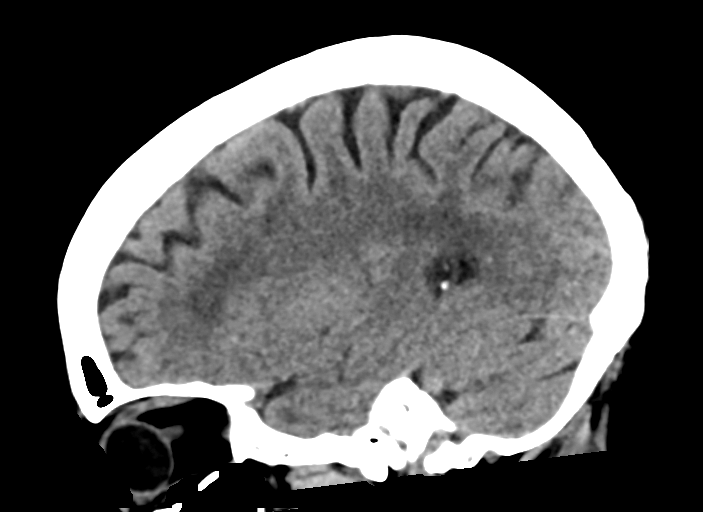
[im 26/51  brain]
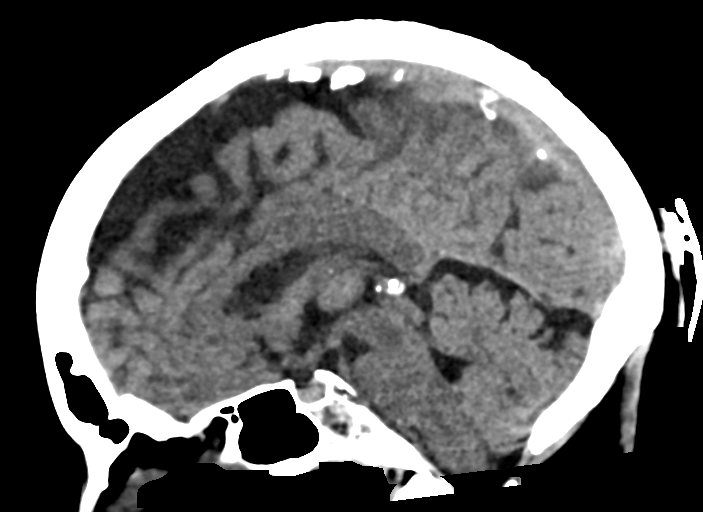
[im 34/51  brain]
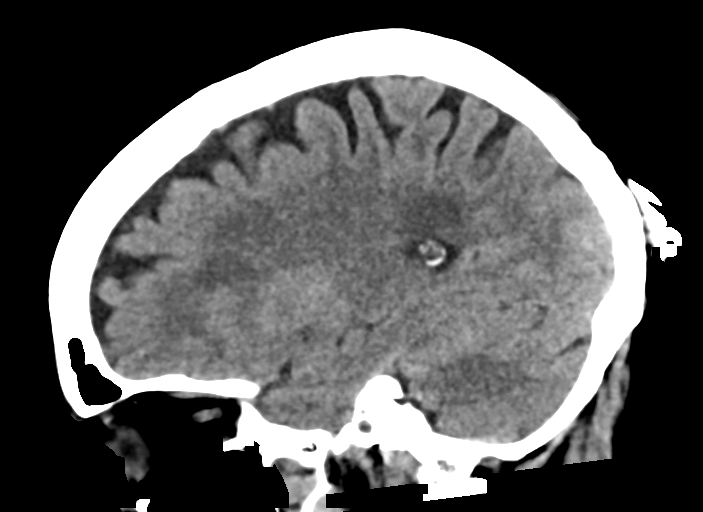

[16 of 47 positions shown; findings below may reference images not displayed]

FINDINGS: Brain: No evidence of acute infarction, hemorrhage, hydrocephalus,
extra-axial collection or mass lesion/mass effect. Superficial
atrophy with sulcal prominence is noted. Chronic mild-to-moderate
periventricular and subcortical white matter hypodensities
compatible with microvascular ischemia. Hydrocephalus. Midline
fourth ventricle and basal cisterns. Cerebellum and brainstem are
stable in appearance without acute appearing abnormality.

Vascular: No hyperdense vessel signs.

Skull: No skull fracture.

Sinuses/Orbits: No acute finding.

Other: No significant scalp soft tissue swelling.
IMPRESSION: 1. No acute intracranial abnormality or skull fracture.
2. Chronic mild-to-moderate small vessel ischemia of periventricular
subcortical white matter.

## 2021-08-26 DIAGNOSIS — Z1231 Encounter for screening mammogram for malignant neoplasm of breast: Secondary | ICD-10-CM | POA: Diagnosis not present

## 2022-10-28 ENCOUNTER — Encounter: Payer: Self-pay | Admitting: Physician Assistant

## 2022-11-27 NOTE — Progress Notes (Signed)
Assessment/Plan:   [name] is a very pleasant 72 y.o. year old RH female with a history of hypertension, hyperlipidemia, chronic pain syndrome on long-term opiate use followed at the pain clinic, PTSD, anxiety with panic attacks, decreased hearing, depression, iron deficiency anemia, hypomagnesemia, vitamin D and B12 deficiency, arthritis seen today for evaluation of memory loss. MoCA today is Patient is on donepezil 10 mg daily by her PCP.  Patient is able to participate on his IADLs and continues to drive.***    Memory Impairment  MRI brain without contrast to assess for underlying structural abnormality and assess vascular load  Neurocognitive testing to further evaluate cognitive concerns and determine other underlying cause of memory changes, including potential contribution from sleep, anxiety, or depression  Check B12, TSH Continue to control mood as per PCP Recommend good control of cardiovascular risk factors Folllow up in  months  Subjective:   The patient is accompanied by ***  who supplements the history.   How long did patient have memory difficulties? For the last *** repeats oneself?  Endorsed Disoriented when walking into a room?  Patient denies except occasionally not remembering what patient came to the room for ***  Leaving objects in unusual places? denies   Wandering behavior?  denies   Any personality changes?  Patient denies   Any history of depression?:  Patient denies   Hallucinations or paranoia?  Patient denies   Seizures?   Patient denies    Any sleep changes?  *** vivid dreams, REM behavior or sleepwalking   Sleep apnea?  Patient denies   Any hygiene concerns?  Patient denies   Independent of bathing and dressing?  Endorsed  Does the patient needs help with medications? Patient is in charge *** Who is in charge of the finances? Patient is in charge   *** Any changes in appetite?  denies ***   Patient have trouble swallowing? denies   Does  the patient cook? ***  Any kitchen accidents such as leaving the stove on? denies   Any headaches?   denies   Chronic back pain ? denies   Ambulates with difficulty?  denies *** Recent falls or head injuries? denies   Vision changes? denies   Unilateral weakness, numbness or tingling? denies   Any tremors?   denies   Any anosmia?  denies   Any incontinence of urine?  She has a history of urine frequency. Any bowel dysfunction? denies      Patient lives with  *** History of heavy alcohol intake? denies   History of heavy tobacco use? denies   Family history of dementia? denies  Does patient drive? Yes ***   Past Medical History:  Diagnosis Date   Anemia    Arthritis    Depression    Hiatal hernia    Kidney stone    Migraine    Migraines      Past Surgical History:  Procedure Laterality Date   ABDOMINAL HYSTERECTOMY     CESAREAN SECTION     JOINT REPLACEMENT     TMJ ARTHROPLASTY       Allergies  Allergen Reactions   Aspirin    Nsaids     Current Outpatient Medications  Medication Instructions   Absorbable Collagen Hemostat (ACTIFOAM COLLAGEN SPONGE) MISC Does not apply   acetaminophen-codeine (TYLENOL #4) 300-60 MG tablet Oral   atorvastatin (LIPITOR) 20 mg, Oral   butalbital-acetaminophen-caffeine (FIORICET, ESGIC) 50-325-40 MG tablet Take 1 at beginning of headache.  Must  last one month.   Calcium Carbonate-Vit D-Min (GNP CALCIUM 1200) 1200-1000 MG-UNIT CHEW 1,200 mg, Oral, Daily with breakfast, Take in combination with vitamin D and magnesium.   Cranberry 500 MG CAPS Oral   cyclobenzaprine (FLEXERIL) 5 mg, Oral, 3 times daily PRN   diphenhydrAMINE (BENADRYL) 25 mg, Oral   donepezil (ARICEPT) 10 mg, Oral   Fenofibric Acid 135 mg, Oral   furosemide (LASIX) 40 mg, Oral   gabapentin (NEURONTIN) 300 MG capsule Take 2 capsules three times daily   lidocaine (LIDODERM) 5 % 1 patch, Transdermal, Every 24 hours, Remove & Discard patch within 12 hours or as directed by  MD   lisinopril (ZESTRIL) 10 mg, Oral   loperamide (IMODIUM) 2 mg, Oral   Melatonin 10 mg, Oral   niacin (VITAMIN B3) 500 mg, Oral   omeprazole (PRILOSEC) 40 mg, Oral   Phenazopyridine HCl 97.5 MG TABS Oral   sertraline (ZOLOFT) 50 MG tablet Take 3 po qd   SUMAtriptan (IMITREX) 50 MG tablet Take one for migraine, may repeat dose in 2 hours if needed x1   vitamin B-12 (CYANOCOBALAMIN) 1000 MCG tablet Oral     VITALS:  There were no vitals filed for this visit.     No data to display          PHYSICAL EXAM   HEENT:  Normocephalic, atraumatic. The superficial temporal arteries are without ropiness or tenderness. Cardiovascular: Regular rate and rhythm. Lungs: Clear to auscultation bilaterally. Neck: There are no carotid bruits noted bilaterally.  NEUROLOGICAL:     No data to display              No data to display           Orientation:  Alert and oriented to person, place and time. No aphasia or dysarthria. Fund of knowledge is appropriate. Recent memory impaired and remote memory intact.  Attention and concentration are normal.  Able to name objects and repeat phrases. Delayed recall  / Cranial nerves: There is good facial symmetry. Extraocular muscles are intact and visual fields are full to confrontational testing. Speech is fluent and clear. No tongue deviation. Hearing is intact to conversational tone. Tone: Tone is good throughout. Sensation: Sensation is intact to light touch. Vibration is intact at the bilateral big toe.  Coordination: The patient has no difficulty with RAM's or FNF bilaterally. Normal finger to nose  Motor: Strength is 5/5 in the bilateral upper and lower extremities. There is no pronator drift. There are no fasciculations noted. DTR's: Deep tendon reflexes are 2/4 bilaterally. Gait and Station: The patient is able to ambulate without difficulty.The patient is able to heel toe walk without any difficulty. Gait is cautious and narrow. The  patient is able to ambulate in a tandem fashion.       Thank you for allowing [place] the opportunity to participate in the care of this nice patient. Please do not hesitate to contact us for any questions or concerns.   Total time spent on today's visit was *** minutes dedicated to this patient today, preparing to see patient, examining the patient, ordering tests and/or medications and counseling the patient, documenting clinical information in the EHR or other health record, independently interpreting results and communicating results to the patient/family, discussing treatment and goals, answering patient's questions and coordinating care.  Cc:  Dolleschel, Irving Burton, FNP  Marlowe Kays 11/27/2022 11:51 AM

## 2022-11-28 ENCOUNTER — Other Ambulatory Visit (INDEPENDENT_AMBULATORY_CARE_PROVIDER_SITE_OTHER): Payer: 59

## 2022-11-28 ENCOUNTER — Ambulatory Visit (INDEPENDENT_AMBULATORY_CARE_PROVIDER_SITE_OTHER): Payer: 59 | Admitting: Physician Assistant

## 2022-11-28 DIAGNOSIS — R413 Other amnesia: Secondary | ICD-10-CM

## 2022-11-28 MED ORDER — DONEPEZIL HCL 10 MG PO TABS: 10.0000 mg | ORAL_TABLET | Freq: Every morning | ORAL | 11 refills | Status: AC

## 2022-11-28 NOTE — Patient Instructions (Signed)

## 2022-11-29 LAB — VITAMIN B12: Vitamin B-12: 331 pg/mL (ref 211–911)

## 2022-11-29 LAB — TSH: TSH: 1.32 u[IU]/mL (ref 0.35–5.50)

## 2023-01-31 ENCOUNTER — Ambulatory Visit: Payer: 59 | Admitting: Physician Assistant

## 2023-03-09 ENCOUNTER — Telehealth: Payer: Self-pay | Admitting: Physician Assistant

## 2023-03-09 NOTE — Telephone Encounter (Signed)
Pt stated her donepezil medication is giving her terrible nightmares and she wants to know if there is something else [facility] can scribe for her.

## 2023-03-09 NOTE — Telephone Encounter (Signed)
Called patient and unable to leave a message

## 2023-03-10 NOTE — Telephone Encounter (Signed)
I advised to hold meds x one week, she will do so and update [place] after that and try to take it in the am after one week.

## 2023-11-08 ENCOUNTER — Ambulatory Visit: Payer: Self-pay

## 2023-11-08 ENCOUNTER — Institutional Professional Consult (permissible substitution): Payer: 59 | Admitting: Psychology

## 2023-11-15 ENCOUNTER — Encounter: Payer: 59 | Admitting: Psychology

## 2023-11-16 ENCOUNTER — Telehealth: Payer: Self-pay | Admitting: Physician Assistant

## 2023-11-16 NOTE — Telephone Encounter (Signed)
 Voicemail is not set up will try and call back 11/16/2023 at 1:52pm

## 2023-11-16 NOTE — Telephone Encounter (Signed)
 Voicemail is not set up again, will try my chart message.

## 2023-11-16 NOTE — Telephone Encounter (Signed)
 Left a message with the after hour service on 11-15-23 at 11:20 pm  Caller states that she was given a medication for dementia that gives her terrible dreams. Did speak to the dr to change the medication time to the morning but it is worse. Pt is afraid to go to sleep. Pt has calls blocked and unsure of how to take it off. [facility] caller got disconnected and calling back in as she can not accept our calls coming to her

## 2023-12-29 ENCOUNTER — Other Ambulatory Visit: Payer: Self-pay | Admitting: Physician Assistant
